# Patient Record
Sex: Male | Born: 1965 | Race: White | Hispanic: No | Marital: Single | Smoking: Never smoker
Health system: Southern US, Community
[De-identification: ages and names within clinical notes are randomized; demographics above are authoritative.]

## PROBLEM LIST (undated history)

## (undated) DIAGNOSIS — F32A Depression, unspecified: Secondary | ICD-10-CM

## (undated) DIAGNOSIS — F419 Anxiety disorder, unspecified: Secondary | ICD-10-CM

## (undated) DIAGNOSIS — F329 Major depressive disorder, single episode, unspecified: Secondary | ICD-10-CM

## (undated) DIAGNOSIS — E119 Type 2 diabetes mellitus without complications: Secondary | ICD-10-CM

## (undated) DIAGNOSIS — I10 Essential (primary) hypertension: Secondary | ICD-10-CM

## (undated) HISTORY — DX: Major depressive disorder, single episode, unspecified: F32.9

## (undated) HISTORY — DX: Type 2 diabetes mellitus without complications: E11.9

## (undated) HISTORY — DX: Depression, unspecified: F32.A

## (undated) HISTORY — DX: Anxiety disorder, unspecified: F41.9

## (undated) HISTORY — DX: Essential (primary) hypertension: I10

---

## 2005-05-21 ENCOUNTER — Emergency Department: Payer: Self-pay | Admitting: Emergency Medicine

## 2006-03-13 ENCOUNTER — Emergency Department: Payer: Self-pay | Admitting: Emergency Medicine

## 2007-07-05 ENCOUNTER — Emergency Department: Payer: Self-pay | Admitting: Emergency Medicine

## 2008-03-29 ENCOUNTER — Emergency Department: Payer: Self-pay | Admitting: Emergency Medicine

## 2010-07-20 ENCOUNTER — Ambulatory Visit: Payer: Self-pay | Admitting: Internal Medicine

## 2015-07-02 ENCOUNTER — Encounter: Payer: Self-pay | Admitting: Family Medicine

## 2015-07-02 ENCOUNTER — Ambulatory Visit (INDEPENDENT_AMBULATORY_CARE_PROVIDER_SITE_OTHER): Payer: Self-pay | Admitting: Family Medicine

## 2015-07-02 ENCOUNTER — Ambulatory Visit: Payer: Self-pay | Admitting: Family Medicine

## 2015-07-02 VITALS — BP 121/77 | HR 58 | Resp 14 | Ht <= 58 in | Wt 265.7 lb

## 2015-07-02 DIAGNOSIS — E119 Type 2 diabetes mellitus without complications: Secondary | ICD-10-CM | POA: Insufficient documentation

## 2015-07-02 DIAGNOSIS — E1140 Type 2 diabetes mellitus with diabetic neuropathy, unspecified: Secondary | ICD-10-CM | POA: Insufficient documentation

## 2015-07-02 DIAGNOSIS — R0789 Other chest pain: Secondary | ICD-10-CM | POA: Insufficient documentation

## 2015-07-02 DIAGNOSIS — E0800 Diabetes mellitus due to underlying condition with hyperosmolarity without nonketotic hyperglycemic-hyperosmolar coma (NKHHC): Secondary | ICD-10-CM

## 2015-07-02 DIAGNOSIS — I10 Essential (primary) hypertension: Secondary | ICD-10-CM | POA: Insufficient documentation

## 2015-07-02 DIAGNOSIS — IMO0002 Reserved for concepts with insufficient information to code with codable children: Secondary | ICD-10-CM | POA: Insufficient documentation

## 2015-07-02 DIAGNOSIS — E1165 Type 2 diabetes mellitus with hyperglycemia: Secondary | ICD-10-CM

## 2015-07-02 DIAGNOSIS — I4891 Unspecified atrial fibrillation: Secondary | ICD-10-CM | POA: Insufficient documentation

## 2015-07-02 NOTE — Progress Notes (Signed)
Name: George Colon   MRN: [id number]    DOB: September 13, 1966   Date:07/02/2015       Progress Note  Subjective  Chief Complaint  Chief Complaint  Patient presents with  . Hypertension    walk in- states 2-3 weeks bp has been 160-165/85-95  . Chest Pain    stopped amlodipine months ago- not taking metformin any longer due to cost.     HPI  Patient walked in to office c/o feeling funn y in his chest.  Says BP has been elevated at home.  Poor social situation.  Has stopped all but his meds except for 2 BP meds. No problem-specific assessment & plan notes found for this encounter.   Past Medical History  Diagnosis Date  . Depression   . Anxiety   . Hypertension   . Diabetes mellitus without complication [facility])     Social History  Substance Use Topics  . Smoking status: Never Smoker   . Smokeless tobacco: Never Used  . Alcohol Use: No     Current outpatient prescriptions:  .  atenolol (TENORMIN) 25 MG tablet, Take 25 mg by mouth daily., Disp: , Rfl:  .  lisinopril (PRINIVIL,ZESTRIL) 20 MG tablet, Take 20 mg by mouth daily. Taking 2 pills daily to , Disp: , Rfl:   No Known Allergies  Review of Systems  Constitutional: Negative for fever and chills.  HENT: Negative for hearing loss.   Eyes: Negative for blurred vision and double vision.  Respiratory: Negative for cough, shortness of breath and wheezing.   Cardiovascular: Positive for chest pain (feelinf funny in chest) and palpitations. Negative for leg swelling.  Gastrointestinal: Negative for heartburn, abdominal pain and blood in stool.  Genitourinary: Negative for dysuria, urgency and frequency.  Neurological: Negative for headaches.      Objective  Filed Vitals:   07/02/15 1124  BP: 121/77  Pulse: 58  Resp: 14  Height:  (1.27 m)  Weight: 265 lb 11.2 oz (120.521 kg)  SpO2: 97%     Physical Exam  Constitutional: He is oriented to person, place, and time and well-developed, well-nourished, and in  no distress. No distress.  HENT:  Head: Normocephalic and atraumatic.  Neck: Carotid bruit is not present.  Cardiovascular: Normal rate and normal heart sounds.  An irregularly irregular rhythm present. Exam reveals no gallop and no friction rub.   No murmur heard. Pulmonary/Chest: Effort normal and breath sounds normal. No respiratory distress. He has no wheezes. He has no rales.  Musculoskeletal: He exhibits no edema.  Neurological: He is alert and oriented to person, place, and time.  Vitals reviewed.     No results found for this or any previous visit (from the past 2160 hour(s)).   Assessment & Plan  1. Other chest pain  - EKG 12-Lead- atr. Fib with slow vent. Response and PACs  2. Diabetes mellitus due to underlying condition with hyperosmolarity without coma, without long-term current use of insulin (HCC)- not taking meds   3. Essential hypertension   4. Atrial fibrillation, unspecified type (HCC)- new onset

## 2015-07-02 NOTE — Patient Instructions (Signed)
To  [facility] or [facility] ER for evaluation of new onset atr. fib

## 2015-07-03 ENCOUNTER — Other Ambulatory Visit: Payer: Self-pay | Admitting: Family Medicine

## 2015-07-03 ENCOUNTER — Telehealth: Payer: Self-pay | Admitting: Family Medicine

## 2015-07-03 ENCOUNTER — Other Ambulatory Visit: Payer: Self-pay

## 2015-07-03 DIAGNOSIS — I4891 Unspecified atrial fibrillation: Secondary | ICD-10-CM

## 2015-07-03 DIAGNOSIS — I159 Secondary hypertension, unspecified: Secondary | ICD-10-CM

## 2015-07-03 DIAGNOSIS — E0800 Diabetes mellitus due to underlying condition with hyperosmolarity without nonketotic hyperglycemic-hyperosmolar coma (NKHHC): Secondary | ICD-10-CM

## 2015-07-03 DIAGNOSIS — R0789 Other chest pain: Secondary | ICD-10-CM

## 2015-07-03 MED ORDER — METOPROLOL TARTRATE 25 MG PO TABS: 25.0000 mg | ORAL_TABLET | Freq: Two times a day (BID) | ORAL | Status: DC

## 2015-07-03 NOTE — Telephone Encounter (Signed)
We cam change his atenolol to Metoprolol tartrate, 25 mg., take 1 tab. Twice a day.  Send in #60/3 refills.  Cont. His Lisinopril 40 mg/d.  Need to recheck his BP in office in 1-2 weeks.  Did they arrange a Cardiac appt. For him there at [facility]?-jh

## 2015-07-03 NOTE — Telephone Encounter (Signed)
Advised pt as per Dr. Juanetta Gosling but he wants to stay with Atenolol and lisinopril will follow up in 1-2 week and [facility] didn't set him up with social service or cardio so we will set him up with those appointment. George Colon

## 2015-07-03 NOTE — Telephone Encounter (Signed)
Pt went to ER at [facility] yesterday and EKG and labs were normal.  His BP was up slightly.  He asked if a cheapedr BP medicine could be called in for him.  Please call [phone].

## 2015-07-04 ENCOUNTER — Other Ambulatory Visit: Payer: Self-pay

## 2015-07-04 ENCOUNTER — Telehealth: Payer: Self-pay | Admitting: Family Medicine

## 2015-07-04 DIAGNOSIS — I10 Essential (primary) hypertension: Secondary | ICD-10-CM

## 2015-07-04 MED ORDER — AMLODIPINE BESYLATE 5 MG PO TABS: 5.0000 mg | ORAL_TABLET | Freq: Every day | ORAL | Status: DC

## 2015-07-04 NOTE — Telephone Encounter (Signed)
Message has been addressed. Asher Muir advised per Dr. Juanetta Gosling Amlodipine sent to pharmacy. Also patient given contact info for [facility] patient assistance. Patient also informed of [facility] paperwork for charity care and rx assistance. Pt advised not to check bp so often as they causes anxiety. Pt advised to report on Monday.

## 2015-07-04 NOTE — Telephone Encounter (Signed)
Pt. Called states that meds that was given to him was not working states that BP was still 170\105, he states that he called EMS last night, he did not go to the ER .Pt call back # is  [phone]

## 2015-07-17 ENCOUNTER — Ambulatory Visit: Payer: Self-pay | Admitting: Family Medicine

## 2015-07-24 ENCOUNTER — Ambulatory Visit: Payer: Self-pay | Admitting: Family Medicine

## 2015-10-17 ENCOUNTER — Ambulatory Visit (INDEPENDENT_AMBULATORY_CARE_PROVIDER_SITE_OTHER): Payer: Self-pay | Admitting: Family Medicine

## 2015-10-17 ENCOUNTER — Encounter: Payer: Self-pay | Admitting: Family Medicine

## 2015-10-17 VITALS — BP 120/85 | HR 60 | Temp 98.0°F | Resp 16 | Ht 72.0 in | Wt 265.0 lb

## 2015-10-17 DIAGNOSIS — R0789 Other chest pain: Secondary | ICD-10-CM

## 2015-10-17 DIAGNOSIS — E119 Type 2 diabetes mellitus without complications: Secondary | ICD-10-CM

## 2015-10-17 DIAGNOSIS — I10 Essential (primary) hypertension: Secondary | ICD-10-CM

## 2015-10-17 LAB — POCT GLYCOSYLATED HEMOGLOBIN (HGB A1C): Hemoglobin A1C: 6.7

## 2015-10-17 MED ORDER — LISINOPRIL 20 MG PO TABS: 20.0000 mg | ORAL_TABLET | Freq: Every day | ORAL | Status: DC

## 2015-10-17 MED ORDER — CARVEDILOL 3.125 MG PO TABS
3.1250 mg | ORAL_TABLET | Freq: Two times a day (BID) | ORAL | Status: DC
Start: 2015-10-17 — End: 2016-10-12

## 2015-10-17 MED ORDER — AMLODIPINE BESYLATE 5 MG PO TABS: 5.0000 mg | ORAL_TABLET | Freq: Every day | ORAL | Status: DC

## 2015-10-17 MED ORDER — METFORMIN HCL 500 MG PO TABS: 500.0000 mg | ORAL_TABLET | ORAL | Status: DC

## 2015-10-17 NOTE — Progress Notes (Signed)
Name: George Colon   MRN: [id number]    DOB: 08-07-1966   Date:10/17/2015       Progress Note  Subjective  Chief Complaint  Chief Complaint  Patient presents with  . Hypertension  . Diabetes    HPI Here for f/u of DM and HBP.  He takes Atenolol instead of Metoprolol.  Don't know when he got or started that.  Admits to not taking Metformin.  Rarely takes it (only about 1-2 doses a month).  He has adjusted Lisinopril to 20 mg/d.  He has reduced his caloric intake and has lost some weight from l;ast year.  No problem-specific assessment & plan notes found for this encounter.   Past Medical History  Diagnosis Date  . Depression   . Anxiety   . Hypertension   . Diabetes mellitus without complication (HCC)     History reviewed. No pertinent past surgical history.  Family History  Problem Relation Age of Onset  . Heart disease Mother     Social History   Social History  . Marital Status: Married    Spouse Name: N/A  . Number of Children: N/A  . Years of Education: N/A   Occupational History  . Not on file.   Social History Main Topics  . Smoking status: Never Smoker   . Smokeless tobacco: Never Used  . Alcohol Use: No  . Drug Use: No  . Sexual Activity: Not on file   Other Topics Concern  . Not on file   Social History Narrative     Current outpatient prescriptions:  .  amLODipine (NORVASC) 5 MG tablet, Take 1 tablet (5 mg total) by mouth daily., Disp: 30 tablet, Rfl: 6 .  lisinopril (PRINIVIL,ZESTRIL) 20 MG tablet, Take 1 tablet (20 mg total) by mouth daily. Taking 1 pill daily of , Disp: 30 tablet, Rfl: 6 .  carvedilol (COREG) 3.125 MG tablet, Take 1 tablet (3.125 mg total) by mouth 2 (two) times daily with a meal., Disp: 60 tablet, Rfl: 6 .  metFORMIN (GLUCOPHAGE) 500 MG tablet, Take 1 tablet (500 mg total) by mouth 1 day or 1 dose., Disp: 30 tablet, Rfl: 6  Not on File   Review of Systems  Constitutional: Positive for weight loss (desired).  Negative for fever, chills and malaise/fatigue.  HENT: Negative for hearing loss.   Eyes: Negative for blurred vision and double vision.  Respiratory: Negative for cough, shortness of breath and wheezing.   Cardiovascular: Negative for chest pain, palpitations and leg swelling.  Gastrointestinal: Negative for heartburn, abdominal pain, diarrhea and blood in stool.  Genitourinary: Negative for dysuria, urgency and frequency.  Skin: Negative for rash.  Neurological: Negative for dizziness, tremors, weakness and headaches.      Objective  Filed Vitals:   10/17/15 0826 10/17/15 0901  BP: 127/80 120/85  Pulse: 50 60  Temp: 98 F (36.7 C)   TempSrc: Oral   Resp: 16   Height: 6' (1.829 m)   Weight: 265 lb (120.203 kg)     Physical Exam  Constitutional: He is oriented to person, place, and time and well-developed, well-nourished, and in no distress. No distress.  HENT:  Head: Normocephalic and atraumatic.  Eyes: Conjunctivae and EOM are normal. Pupils are equal, round, and reactive to light. No scleral icterus.  Neck: Normal range of motion. Neck supple. No thyromegaly present.  Cardiovascular: Normal rate and regular rhythm.  Exam reveals no gallop and no friction rub.   No murmur heard. Pulmonary/Chest: Effort  normal and breath sounds normal. No respiratory distress. He has no wheezes. He has no rales.  Abdominal: Soft. Bowel sounds are normal. He exhibits no distension and no mass. There is no tenderness.  Musculoskeletal: He exhibits no edema.  Lymphadenopathy:    He has no cervical adenopathy.  Neurological: He is alert and oriented to person, place, and time.  Vitals reviewed.      Recent Results (from the past 2160 hour(s))  POCT HgB A1C     Status: Normal   Collection Time: 10/17/15  8:41 AM  Result Value Ref Range   Hemoglobin A1C 6.7 %      Assessment & Plan  Problem List Items Addressed This Visit      Cardiovascular and Mediastinum   Hypertension    Relevant Medications   amLODipine (NORVASC) 5 MG tablet   lisinopril (PRINIVIL,ZESTRIL) 20 MG tablet   carvedilol (COREG) 3.125 MG tablet     Endocrine   Diabetes (HCC) - Primary   Relevant Medications   metFORMIN (GLUCOPHAGE) 500 MG tablet   lisinopril (PRINIVIL,ZESTRIL) 20 MG tablet   Other Relevant Orders   POCT HgB A1C (Completed)     Other   Chest pain      Meds ordered this encounter  Medications  . DISCONTD: metFORMIN (GLUCOPHAGE) 500 MG tablet    Sig: Take 500 mg by mouth 2 (two) times daily with a meal. Reported on 10/17/2015.  Rarely takes Metformin (once every 2-4 weeks wqhen he thinks he needs it).  . DISCONTD: atenolol (TENORMIN) 25 MG tablet    Sig: Take 25 mg by mouth daily. Takes 1 daily  . amLODipine (NORVASC) 5 MG tablet    Sig: Take 1 tablet (5 mg total) by mouth daily.    Dispense:  30 tablet    Refill:  6  . metFORMIN (GLUCOPHAGE) 500 MG tablet    Sig: Take 1 tablet (500 mg total) by mouth 1 day or 1 dose.    Dispense:  30 tablet    Refill:  6  . lisinopril (PRINIVIL,ZESTRIL) 20 MG tablet    Sig: Take 1 tablet (20 mg total) by mouth daily. Taking 1 pill daily of     Dispense:  30 tablet    Refill:  6  . carvedilol (COREG) 3.125 MG tablet    Sig: Take 1 tablet (3.125 mg total) by mouth 2 (two) times daily with a meal.    Dispense:  60 tablet    Refill:  6   1. Type 2 diabetes mellitus without complication, without long-term current use of insulin (HCC)  - POCT HgB A1C-6.7 - metFORMIN (GLUCOPHAGE) 500 MG tablet; Take 1 tablet (500 mg total) by mouth 1 day or 1 dose.  Dispense: 30 tablet; Refill: 6  2. Essential hypertension  - amLODipine (NORVASC) 5 MG tablet; Take 1 tablet (5 mg total) by mouth daily.  Dispense: 30 tablet; Refill: 6 - lisinopril (PRINIVIL,ZESTRIL) 20 MG tablet; Take 1 tablet (20 mg total) by mouth daily. Taking 1 pill daily of   Dispense: 30 tablet; Refill: 6 - carvedilol (COREG) 3.125 MG tablet; Take 1 tablet (3.125 mg  total) by mouth 2 (two) times daily with a meal.  Dispense: 60 tablet; Refill: 6  3. Other chest pain

## 2015-11-01 ENCOUNTER — Other Ambulatory Visit: Payer: Self-pay | Admitting: Family Medicine

## 2016-02-09 ENCOUNTER — Emergency Department
Admission: EM | Admit: 2016-02-09 | Discharge: 2016-02-09 | Disposition: A | Discharge status: Home / Self Care | Payer: Self-pay | Attending: Emergency Medicine | Admitting: Emergency Medicine

## 2016-02-09 DIAGNOSIS — Z7984 Long term (current) use of oral hypoglycemic drugs: Secondary | ICD-10-CM | POA: Insufficient documentation

## 2016-02-09 DIAGNOSIS — F329 Major depressive disorder, single episode, unspecified: Secondary | ICD-10-CM | POA: Insufficient documentation

## 2016-02-09 DIAGNOSIS — I4891 Unspecified atrial fibrillation: Secondary | ICD-10-CM | POA: Insufficient documentation

## 2016-02-09 DIAGNOSIS — Z79899 Other long term (current) drug therapy: Secondary | ICD-10-CM | POA: Insufficient documentation

## 2016-02-09 DIAGNOSIS — I10 Essential (primary) hypertension: Secondary | ICD-10-CM | POA: Insufficient documentation

## 2016-02-09 DIAGNOSIS — E119 Type 2 diabetes mellitus without complications: Secondary | ICD-10-CM | POA: Insufficient documentation

## 2016-02-09 DIAGNOSIS — H66001 Acute suppurative otitis media without spontaneous rupture of ear drum, right ear: Secondary | ICD-10-CM | POA: Insufficient documentation

## 2016-02-09 DIAGNOSIS — H6121 Impacted cerumen, right ear: Secondary | ICD-10-CM | POA: Insufficient documentation

## 2016-02-09 MED ORDER — FLUTICASONE FUROATE 27.5 MCG/SPRAY NA SUSP: 2.0000 | Freq: Every day | NASAL | Status: DC

## 2016-02-09 MED ORDER — AMOXICILLIN 500 MG PO TABS: 500.0000 mg | ORAL_TABLET | Freq: Three times a day (TID) | ORAL | Status: DC

## 2016-02-09 MED ORDER — AMOXICILLIN 500 MG PO CAPS
500.0000 mg | ORAL_CAPSULE | Freq: Once | ORAL | Status: AC
  Administered 2016-02-09: 500 mg via ORAL
  Filled 2016-02-09: qty 1

## 2016-02-09 MED ORDER — CARBAMIDE PEROXIDE 6.5 % OT SOLN
5.0000 [drp] | Freq: Once | OTIC | Status: AC
  Administered 2016-02-09: 5 [drp] via OTIC
  Filled 2016-02-09: qty 15

## 2016-02-09 MED ORDER — TRAMADOL HCL 50 MG PO TABS: 50.0000 mg | ORAL_TABLET | Freq: Four times a day (QID) | ORAL | Status: DC | PRN

## 2016-02-09 MED ORDER — IBUPROFEN 600 MG PO TABS: 600.0000 mg | ORAL_TABLET | Freq: Once | ORAL | Status: DC

## 2016-02-09 NOTE — ED Notes (Signed)
Pt reports pain to his right ear canal for 2 to 3 days. Pt denies signs of cold or sinus infections. Pt denies fever or drainage.

## 2016-02-09 NOTE — Discharge Instructions (Signed)
Otitis Media, Adult °Otitis media is redness, soreness, and inflammation of the middle ear. Otitis media may be caused by allergies or, most commonly, by infection. Often it occurs as a complication of the common cold. °SIGNS AND SYMPTOMS °Symptoms of otitis media may include: °· Earache. °· Fever. °· Ringing in your ear. °· Headache. °· Leakage of fluid from the ear. °DIAGNOSIS °To diagnose otitis media, your health care provider will examine your ear with an otoscope. This is an instrument that allows your health care provider to see into your ear in order to examine your eardrum. Your health care provider also will ask you questions about your symptoms. °TREATMENT  °Typically, otitis media resolves on its own within 3-5 days. Your health care provider may prescribe medicine to ease your symptoms of pain. If otitis media does not resolve within 5 days or is recurrent, your health care provider may prescribe antibiotic medicines if he or she suspects that a bacterial infection is the cause. °HOME CARE INSTRUCTIONS  °· If you were prescribed an antibiotic medicine, finish it all even if you start to feel better. °· Take medicines only as directed by your health care provider. °· Keep all follow-up visits as directed by your health care provider. °SEEK MEDICAL CARE IF: °· You have otitis media only in one ear, or bleeding from your nose, or both. °· You notice a lump on your neck. °· You are not getting better in 3-5 days. °· You feel worse instead of better. °SEEK IMMEDIATE MEDICAL CARE IF:  °· You have pain that is not controlled with medicine. °· You have swelling, redness, or pain around your ear or stiffness in your neck. °· You notice that part of your face is paralyzed. °· You notice that the bone behind your ear (mastoid) is tender when you touch it. °MAKE SURE YOU:  °· Understand these instructions. °· Will watch your condition. °· Will get help right away if you are not doing well or get worse. °  °This  information is not intended to replace advice given to you by your health care provider. Make sure you discuss any questions you have with your health care provider. °  °Document Released: 06/19/2004 Document Revised: 10/05/2014 Document Reviewed: 04/11/2013 °Elsevier Interactive Patient Education ©2016 [employer] ° °Cerumen Impaction °The structures of the external ear canal secrete a waxy substance known as cerumen. Excess cerumen can build up in the ear canal, causing a condition known as cerumen impaction. Cerumen impaction can cause ear pain and disrupt the function of the ear. °The rate of cerumen production differs for each individual. In certain individuals, the configuration of the ear canal may decrease his or her ability to naturally remove cerumen. °CAUSES °Cerumen impaction is caused by excessive cerumen production or buildup. °RISK FACTORS °· Frequent use of swabs to clean ears. °· Having narrow ear canals. °· Having eczema. °· Being dehydrated. °SIGNS AND SYMPTOMS °· Diminished hearing. °· Ear drainage. °· Ear pain. °· Ear itch. °TREATMENT °Treatment may involve: °· Over-the-counter or prescription ear drops to soften the cerumen. °· Removal of cerumen by a health care provider. This may be done with: °¨ Irrigation with warm water. This is the most common method of removal. °¨ Ear curettes and other instruments. °¨ Surgery. This may be done in severe cases. °HOME CARE INSTRUCTIONS °· Take medicines only as directed by your health care provider. °· Do not insert objects into the ear with the intent of cleaning the ear. °PREVENTION °·   Do not insert objects into the ear, even with the intent of cleaning the ear. Removing cerumen as a part of normal hygiene is not necessary, and the use of swabs in the ear canal is not recommended. °· Drink enough water to keep your urine clear or pale yellow. °· Control your eczema if you have it. °SEEK MEDICAL CARE IF: °· You develop ear pain. °· You develop bleeding  from the ear. °· The cerumen does not clear after you use ear drops as directed. °  °This information is not intended to replace advice given to you by your health care provider. Make sure you discuss any questions you have with your health care provider. °  °Document Released: 10/22/2004 Document Revised: 10/05/2014 Document Reviewed: 05/01/2015 °Elsevier Interactive Patient Education ©2016 Elsevier Inc. ° °

## 2016-02-09 NOTE — ED Provider Notes (Signed)
[facility] Emergency Department Provider Note ____________________________________________  Time seen: Approximately 10:35 PM  I have reviewed the triage vital signs and the nursing notes.   HISTORY  Chief Complaint Otalgia    HPI George Colon is a 50 y.o. male who presents to the emergency department for evaluation of right ear pain. He reports pain has been present for the past 2-3 days and his hearing has decreased. He denies recent URI or fevers.   Past Medical History  Diagnosis Date  . Depression   . Anxiety   . Hypertension   . Diabetes mellitus without complication [facility])     Patient Active Problem List   Diagnosis Date Noted  . Diabetes (HCC) 07/02/2015  . Hypertension 07/02/2015  . Atrial fibrillation (HCC) 07/02/2015  . Chest pain 07/02/2015    No past surgical history on file.  Current Outpatient Rx  Name  Route  Sig  Dispense  Refill  . amLODipine (NORVASC) 5 MG tablet   Oral   Take 1 tablet (5 mg total) by mouth daily.   30 tablet   6   . amoxicillin (AMOXIL) 500 MG tablet   Oral   Take 1 tablet (500 mg total) by mouth 3 (three) times daily.   30 tablet   0   . carvedilol (COREG) 3.125 MG tablet   Oral   Take 1 tablet (3.125 mg total) by mouth 2 (two) times daily with a meal.   60 tablet   6   . fluticasone (VERAMYST) 27.5 MCG/SPRAY nasal spray   Nasal   Place 2 sprays into the nose daily.   10 g   0   . lisinopril (PRINIVIL,ZESTRIL) 20 MG tablet      TAKE TWO TABLETS BY MOUTH ONCE DAILY   60 tablet   0   . metFORMIN (GLUCOPHAGE) 500 MG tablet   Oral   Take 1 tablet (500 mg total) by mouth 1 day or 1 dose.   30 tablet   6   . traMADol (ULTRAM) 50 MG tablet   Oral   Take 1 tablet (50 mg total) by mouth every 6 (six) hours as needed.   12 tablet   0     Allergies Review of patient's allergies indicates no known allergies.  Family History  Problem Relation Age of Onset  . Heart disease Mother      Social History Social History  Substance Use Topics  . Smoking status: Never Smoker   . Smokeless tobacco: Never Used  . Alcohol Use: No    Review of Systems Constitutional: Negative for fever/chills Eyes: No visual changes. ENT: Positive for right earache. Gastrointestinal: No abdominal pain.  No nausea, no vomiting.  No diarrhea.  No constipation. Musculoskeletal: Negative for pain. Skin: Negative for rash. Neurological: Negative for headaches, focal weakness or numbness. ____________________________________________   PHYSICAL EXAM:  VITAL SIGNS: ED Triage Vitals  Enc Vitals Group     BP 02/09/16 2228 138/95 mmHg     Pulse Rate 02/09/16 2228 78     Resp 02/09/16 2228 18     Temp 02/09/16 2228 98.5 F (36.9 C)     Temp Source 02/09/16 2228 Oral     SpO2 02/09/16 2228 96 %     Weight 02/09/16 2224 270 lb (122.471 kg)     Height 02/09/16 2224 6' (1.829 m)     Head Cir --      Peak Flow --      Pain  Score 02/09/16 2224 1     Pain Loc --      Pain Edu? --      Excl. in GC? --     Constitutional: Alert and oriented. Well appearing and in no acute distress. Eyes: Conjunctivae are normal. PERRL. EOMI. Ears: No pain with movement of right auricle:; External canal clear;  Right TM occluded by cerumen; Left TM clear/normal   Head: Atraumatic. Nose: No congestion/rhinnorhea. Mouth/Throat: Mucous membranes are moist.  Oropharynx non-erythematous. Hematological/Lymphatic/Immunilogical: No cervical lymphadenopathy. Cardiovascular: Normal capillary refill. Respiratory: Normal respiratory effort.  No retractions.  Neurologic:  Normal speech and language. No gross focal neurologic deficits are appreciated. Speech is normal. No gait instability. Skin:  Skin is warm, dry and intact. No rash noted. ____________________________________________   LABS (all labs ordered are listed, but only abnormal results are displayed)  Labs Reviewed - No data to  display ____________________________________________   RADIOLOGY  Not indicated. ____________________________________________   PROCEDURES  Procedure(s) performed:   Right ear irrigated by me. Large amount of wax returned from irrigation with peroxide and warm water.   ____________________________________________   INITIAL IMPRESSION / ASSESSMENT AND PLAN / ED COURSE  Pertinent labs & imaging results that were available during my care of the patient were reviewed by me and considered in my medical decision making (see chart for details).  Prescriptions for amoxicillin, fluticasone, and tramadol will be given today.  The patient was advised to follow up with PCP for symptoms that are not improving over the next 48 hours. He was advised to return to the emergency department for symptoms that change or worsen if unable to schedule an appointment.  ____________________________________________   FINAL CLINICAL IMPRESSION(S) / ED DIAGNOSES  Final diagnoses:  Cerumen impaction, right  Acute suppurative otitis media of right ear without spontaneous rupture of tympanic membrane, recurrence not specified    Note:  This document was prepared using Dragon voice recognition software and may include unintentional dictation errors.     Chinita PesterCari B Savita Runner, FNP 02/09/16 [id number]  Sharyn CreamerMark Quale, MD 02/15/16 2115

## 2016-02-14 ENCOUNTER — Telehealth: Payer: Self-pay | Admitting: Family Medicine

## 2016-02-14 NOTE — Telephone Encounter (Signed)
Appt has been scheduled 02/20/16.[place]

## 2016-02-14 NOTE — Telephone Encounter (Signed)
Pt.have question about ear pain .  Pt call back # is  [phone]

## 2016-02-15 ENCOUNTER — Emergency Department
Admission: EM | Admit: 2016-02-15 | Discharge: 2016-02-15 | Disposition: A | Discharge status: Home / Self Care | Payer: Self-pay | Attending: Student | Admitting: Student

## 2016-02-15 ENCOUNTER — Encounter: Payer: Self-pay | Admitting: Emergency Medicine

## 2016-02-15 DIAGNOSIS — I10 Essential (primary) hypertension: Secondary | ICD-10-CM | POA: Insufficient documentation

## 2016-02-15 DIAGNOSIS — Z79899 Other long term (current) drug therapy: Secondary | ICD-10-CM | POA: Insufficient documentation

## 2016-02-15 DIAGNOSIS — Z7951 Long term (current) use of inhaled steroids: Secondary | ICD-10-CM | POA: Insufficient documentation

## 2016-02-15 DIAGNOSIS — Z7984 Long term (current) use of oral hypoglycemic drugs: Secondary | ICD-10-CM | POA: Insufficient documentation

## 2016-02-15 DIAGNOSIS — F329 Major depressive disorder, single episode, unspecified: Secondary | ICD-10-CM | POA: Insufficient documentation

## 2016-02-15 DIAGNOSIS — I4891 Unspecified atrial fibrillation: Secondary | ICD-10-CM | POA: Insufficient documentation

## 2016-02-15 DIAGNOSIS — E119 Type 2 diabetes mellitus without complications: Secondary | ICD-10-CM | POA: Insufficient documentation

## 2016-02-15 DIAGNOSIS — H6091 Unspecified otitis externa, right ear: Secondary | ICD-10-CM | POA: Insufficient documentation

## 2016-02-15 MED ORDER — PSEUDOEPHEDRINE HCL ER 120 MG PO TB12: 120.0000 mg | ORAL_TABLET | Freq: Two times a day (BID) | ORAL | Status: DC | PRN

## 2016-02-15 MED ORDER — CIPROFLOXACIN-DEXAMETHASONE 0.3-0.1 % OT SUSP: 4.0000 [drp] | Freq: Two times a day (BID) | OTIC | Status: DC

## 2016-02-15 MED ORDER — OXYCODONE-ACETAMINOPHEN 7.5-325 MG PO TABS: 1.0000 | ORAL_TABLET | ORAL | Status: DC | PRN

## 2016-02-15 NOTE — ED Provider Notes (Signed)
[facility] Emergency Department Provider Note   ____________________________________________  Time seen: Approximately 5:14 PM  I have reviewed the triage vital signs and the nursing notes.   HISTORY  Chief Complaint Otalgia    HPI George Colon is a 50 y.o. male patient complaining of right ear pain. Patient was seen 6 days ago was diagnosed with otitis media and prescribed tramadol and amoxicillin. Patient state pain is getting worse in the right ear. Patient states him try to clean the ear with Q-tips. Patient also pertinent over-the-counter antibiotic cream is placed in the ear canal. Patient denies any hearing loss or vertigo. Patient rated his pain as a 7/10. No other palliative measures for this complaint. Patient described a pain as "sharp".   Past Medical History  Diagnosis Date  . Depression   . Anxiety   . Hypertension   . Diabetes mellitus without complication [facility])     Patient Active Problem List   Diagnosis Date Noted  . Diabetes (HCC) 07/02/2015  . Hypertension 07/02/2015  . Atrial fibrillation (HCC) 07/02/2015  . Chest pain 07/02/2015    History reviewed. No pertinent past surgical history.  Current Outpatient Rx  Name  Route  Sig  Dispense  Refill  . amLODipine (NORVASC) 5 MG tablet   Oral   Take 1 tablet (5 mg total) by mouth daily.   30 tablet   6   . amoxicillin (AMOXIL) 500 MG tablet   Oral   Take 1 tablet (500 mg total) by mouth 3 (three) times daily.   30 tablet   0   . carvedilol (COREG) 3.125 MG tablet   Oral   Take 1 tablet (3.125 mg total) by mouth 2 (two) times daily with a meal.   60 tablet   6   . ciprofloxacin-dexamethasone (CIPRODEX) otic suspension   Left Ear   Place 4 drops into the left ear 2 (two) times daily.   7.5 mL   0   . fluticasone (VERAMYST) 27.5 MCG/SPRAY nasal spray   Nasal   Place 2 sprays into the nose daily.   10 g   0   . lisinopril (PRINIVIL,ZESTRIL) 20 MG tablet        TAKE TWO TABLETS BY MOUTH ONCE DAILY   60 tablet   0   . metFORMIN (GLUCOPHAGE) 500 MG tablet   Oral   Take 1 tablet (500 mg total) by mouth 1 day or 1 dose.   30 tablet   6   . oxyCODONE-acetaminophen (PERCOCET) 7.5-325 MG tablet   Oral   Take 1 tablet by mouth every 4 (four) hours as needed for severe pain.   20 tablet   0   . pseudoephedrine (SUDAFED) 120 MG 12 hr tablet   Oral   Take 1 tablet (120 mg total) by mouth 2 (two) times daily as needed for congestion.   20 tablet   2   . traMADol (ULTRAM) 50 MG tablet   Oral   Take 1 tablet (50 mg total) by mouth every 6 (six) hours as needed.   12 tablet   0     Allergies Review of patient's allergies indicates no known allergies.  Family History  Problem Relation Age of Onset  . Heart disease Mother     Social History Social History  Substance Use Topics  . Smoking status: Never Smoker   . Smokeless tobacco: Never Used  . Alcohol Use: No    Review of Systems Constitutional:  No fever/chills Eyes: No visual changes. ENT: No sore throat. Cardiovascular: Denies chest pain. Respiratory: Denies shortness of breath. Gastrointestinal: No abdominal pain.  No nausea, no vomiting.  No diarrhea.  No constipation. Genitourinary: Negative for dysuria. Musculoskeletal: Negative for back pain. Skin: Negative for rash. Neurological: Negative for headaches, focal weakness or numbness. Psychiatric:Anxiety and depression. Endocrine:Diabetes and hypertension. ____________________________________________   PHYSICAL EXAM:  VITAL SIGNS: ED Triage Vitals  Enc Vitals Group     BP 02/15/16 1523 150/98 mmHg     Pulse Rate 02/15/16 1523 72     Resp 02/15/16 1523 18     Temp 02/15/16 1523 98.1 F (36.7 C)     Temp Source 02/15/16 1523 Oral     SpO2 02/15/16 1523 97 %     Weight 02/15/16 1523 270 lb (122.471 kg)     Height 02/15/16 1523 6' (1.829 m)     Head Cir --      Peak Flow --      Pain Score 02/15/16 1523 7      Pain Loc --      Pain Edu? --      Excl. in GC? --     Constitutional: Alert and oriented. Well appearing and in no acute distress. Eyes: Conjunctivae are normal. PERRL. EOMI. Head: Atraumatic. Nose: No congestion/rhinnorhea. EARS: Right ear canal is edematous with mild edema. TM is edematous but nonerythematous. Mouth/Throat: Mucous membranes are moist.  Oropharynx non-erythematous. Neck: No stridor.  No cervical spine tenderness to palpation. Cardiovascular: Normal rate, regular rhythm. Grossly normal heart sounds.  Good peripheral circulation. Respiratory: Normal respiratory effort.  No retractions. Lungs CTAB. Gastrointestinal: Soft and nontender. No distention. No abdominal bruits. No CVA tenderness. Musculoskeletal: No lower extremity tenderness nor edema.  No joint effusions. Neurologic:  Normal speech and language. No gross focal neurologic deficits are appreciated. No gait instability. Skin:  Skin is warm, dry and intact. No rash noted. Psychiatric: Mood and affect are normal. Speech and behavior are normal.  ____________________________________________   LABS (all labs ordered are listed, but only abnormal results are displayed)  Labs Reviewed - No data to display ____________________________________________  EKG   ____________________________________________  RADIOLOGY   ____________________________________________   PROCEDURES  Procedure(s) performed: None  Critical Care performed: No  ____________________________________________   INITIAL IMPRESSION / ASSESSMENT AND PLAN / ED COURSE  Pertinent labs & imaging results that were available during my care of the patient were reviewed by me and considered in my medical decision making (see chart for details).  Resolving otitis media with right otitis external.Continue taking antibiotics as directed. Patient given a prescription for Ciprodex and Percocet. Patient advised to follow-up with the ENT clinic  if there is no improvement in 2 days. ____________________________________________   FINAL CLINICAL IMPRESSION(S) / ED DIAGNOSES  Final diagnoses:  External otitis of right ear      NEW MEDICATIONS STARTED DURING THIS VISIT:  New Prescriptions   CIPROFLOXACIN-DEXAMETHASONE (CIPRODEX) OTIC SUSPENSION    Place 4 drops into the left ear 2 (two) times daily.   OXYCODONE-ACETAMINOPHEN (PERCOCET) 7.5-325 MG TABLET    Take 1 tablet by mouth every 4 (four) hours as needed for severe pain.   PSEUDOEPHEDRINE (SUDAFED) 120 MG 12 HR TABLET    Take 1 tablet (120 mg total) by mouth 2 (two) times daily as needed for congestion.     Note:  This document was prepared using Dragon voice recognition software and may include unintentional dictation errors.    Arther Abbottonald K  Katrinka Blazing, PA-C 02/15/16 1733  Gayla Doss, MD 02/16/16 [phone]

## 2016-02-15 NOTE — Discharge Instructions (Signed)
Otitis Externa °Otitis externa is a germ infection in the outer ear. The outer ear is the area from the eardrum to the outside of the ear. Otitis externa is sometimes called "swimmer's ear." °HOME CARE °· Put drops in the ear as told by your doctor. °· Only take medicine as told by your doctor. °· If you have diabetes, your doctor may give you more directions. Follow your doctor's directions. °· Keep all doctor visits as told. °To avoid another infection: °· Keep your ear dry. Use the corner of a towel to dry your ear after swimming or bathing. °· Avoid scratching or putting things inside your ear. °· Avoid swimming in lakes, dirty water, or pools that use a chemical called chlorine poorly. °· You may use ear drops after swimming. Combine equal amounts of white vinegar and alcohol in a bottle. Put 3 or 4 drops in each ear. °GET HELP IF:  °· You have a fever. °· Your ear is still red, puffy (swollen), or painful after 3 days. °· You still have yellowish-white fluid (pus) coming from the ear after 3 days. °· Your redness, puffiness, or pain gets worse. °· You have a really bad headache. °· You have redness, puffiness, pain, or tenderness behind your ear. °MAKE SURE YOU:  °· Understand these instructions. °· Will watch your condition. °· Will get help right away if you are not doing well or get worse. °  °This information is not intended to replace advice given to you by your health care provider. Make sure you discuss any questions you have with your health care provider. °  °Document Released: 03/02/2008 Document Revised: 10/05/2014 Document Reviewed: 10/01/2011 °Elsevier Interactive Patient Education ©2016 [employer] ° °Ear Drops, Adult °You need to put eardrops in your ear. °HOME CARE  °· Put drops in your affected ear as told. °· After putting in the drops, lie down with the ear you put the drops in facing up. Stay this way for 10 minutes. Use the ear drops as long as your doctor tells you. °· Before you get  up, put a cotton ball gently in your ear. Do not push it far in your ear. °· Do not wash out your ears unless your doctor says it is okay. °· Finish all medicines as told by your doctor. You may be told to keep using the eardrops even if you start to feel better. °· See your doctor as told for follow-up visits. °GET HELP IF: °· You have pain that gets worse. °· Any unusual fluid (drainage) is coming from your ear (especially if the fluid stinks). °· You have trouble hearing. °· You get really dizzy as if the room is spinning and feel sick to your stomach (vertigo). °· The outside of your ear becomes red or puffy or both. This may be a sign of an allergic reaction. °MAKE SURE YOU:  °· Understand these instructions. °· Will watch your condition. °· Will get help right away if you are not doing well or get worse. °  °This information is not intended to replace advice given to you by your health care provider. Make sure you discuss any questions you have with your health care provider. °  °Document Released: 03/04/2010 Document Revised: 10/05/2014 Document Reviewed: 04/11/2013 °Elsevier Interactive Patient Education ©2016 [employer] ° °

## 2016-02-15 NOTE — ED Notes (Signed)
Right ear pain, was seen here for the same on Monday, has been taking antibiotics, feels pain is way worse.

## 2016-02-15 NOTE — ED Notes (Addendum)
Patient continues to c/o ear pain on the right side. Taking Amoxicillin as prescribed, but is getting worse.  Patient c/o pain in the side of his head.

## 2016-02-18 ENCOUNTER — Ambulatory Visit (INDEPENDENT_AMBULATORY_CARE_PROVIDER_SITE_OTHER): Payer: Self-pay | Admitting: Family Medicine

## 2016-02-18 ENCOUNTER — Encounter: Payer: Self-pay | Admitting: Family Medicine

## 2016-02-18 VITALS — BP 117/85 | HR 65 | Temp 98.6°F | Resp 16 | Ht 72.0 in | Wt 276.0 lb

## 2016-02-18 DIAGNOSIS — H6090 Unspecified otitis externa, unspecified ear: Secondary | ICD-10-CM | POA: Insufficient documentation

## 2016-02-18 DIAGNOSIS — H6091 Unspecified otitis externa, right ear: Secondary | ICD-10-CM

## 2016-02-18 NOTE — Progress Notes (Signed)
Name: George Colon   MRN: [id number]    DOB: [birth year]   Date:02/18/2016       Progress Note  Subjective  Chief Complaint  Chief Complaint  Patient presents with  . Ear Pain    Right side onset 10 days saturday was worst pt has ear drops helps some    HPI Here c/o R ear pain for about 10 - 14 days.  Started as decreased hearing and some pain.  @ trips to ER .  Treated with Amoxil then later with Cortipsorin otic drops (3 days ago.  Some improvement in pain since starting drops.  No fever.  Pain to move ear.  No problem-specific assessment & plan notes found for this encounter.   Past Medical History  Diagnosis Date  . Depression   . Anxiety   . Hypertension   . Diabetes mellitus without complication [facility])     Social History  Substance Use Topics  . Smoking status: Never Smoker   . Smokeless tobacco: Never Used  . Alcohol Use: No     Current outpatient prescriptions:  .  amLODipine (NORVASC) 5 MG tablet, Take 1 tablet (5 mg total) by mouth daily., Disp: 30 tablet, Rfl: 6 .  amoxicillin (AMOXIL) 500 MG tablet, Take 1 tablet (500 mg total) by mouth 3 (three) times daily., Disp: 30 tablet, Rfl: 0 .  carvedilol (COREG) 3.125 MG tablet, Take 1 tablet (3.125 mg total) by mouth 2 (two) times daily with a meal., Disp: 60 tablet, Rfl: 6 .  ciprofloxacin-dexamethasone (CIPRODEX) otic suspension, Place 4 drops into the left ear 2 (two) times daily., Disp: 7.5 mL, Rfl: 0 .  fluticasone (VERAMYST) 27.5 MCG/SPRAY nasal spray, Place 2 sprays into the nose daily., Disp: 10 g, Rfl: 0 .  lisinopril (PRINIVIL,ZESTRIL) 20 MG tablet, TAKE TWO TABLETS BY MOUTH ONCE DAILY, Disp: 60 tablet, Rfl: 0 .  metFORMIN (GLUCOPHAGE) 500 MG tablet, Take 1 tablet (500 mg total) by mouth 1 day or 1 dose., Disp: 30 tablet, Rfl: 6 .  oxyCODONE-acetaminophen (PERCOCET) 7.5-325 MG tablet, Take 1 tablet by mouth every 4 (four) hours as needed for severe pain., Disp: 20 tablet, Rfl: 0 .  pseudoephedrine (SUDAFED)  120 MG 12 hr tablet, Take 1 tablet (120 mg total) by mouth 2 (two) times daily as needed for congestion., Disp: 20 tablet, Rfl: 2 .  traMADol (ULTRAM) 50 MG tablet, Take 1 tablet (50 mg total) by mouth every 6 (six) hours as needed., Disp: 12 tablet, Rfl: 0  Not on File  Review of Systems  Constitutional: Negative for fever, chills, weight loss and malaise/fatigue.  HENT: Positive for ear pain, hearing loss and tinnitus. Negative for congestion, ear discharge and sore throat.   Eyes: Negative for blurred vision and double vision.  Respiratory: Negative for cough, shortness of breath and wheezing.   Cardiovascular: Negative for chest pain, palpitations and leg swelling.  Gastrointestinal: Negative for heartburn, abdominal pain and blood in stool.  Genitourinary: Negative for dysuria, urgency and frequency.  Skin: Negative for rash.  Neurological: Negative for weakness and headaches.      Objective  Filed Vitals:   02/18/16 0759  BP: 117/85  Pulse: 65  Temp: 98.6 F (37 C)  TempSrc: Oral  Resp: 16  Height: 6' (1.829 m)  Weight: 276 lb (125.193 kg)     Physical Exam  Constitutional: He is well-developed, well-nourished, and in no distress. No distress.  HENT:  Head: Normocephalic and atraumatic.  Right Ear: Tympanic membrane  normal. There is tenderness. Decreased hearing is noted.  Left Ear: External ear normal.  Ears:  Nose: Nose normal.  Mouth/Throat: Oropharynx is clear and moist.  Neck: Normal range of motion. Neck supple. No thyromegaly present.  Cardiovascular: Normal rate, regular rhythm and normal heart sounds.   Pulmonary/Chest: Effort normal and breath sounds normal. No respiratory distress. He has no wheezes. He has no rales.  Lymphadenopathy:    He has no cervical adenopathy.       Right cervical: No superficial cervical, no deep cervical and no posterior cervical adenopathy present.      Left cervical: No superficial cervical, no deep cervical and no  posterior cervical adenopathy present.  Vitals reviewed.     No results found for this or any previous visit (from the past 2160 hour(s)).   Assessment & Plan  1. Otitis externa, right Finish Amoxil and cont Cortisporin Otic drops 4 times a day. See Dr. Willeen CassBennett if not resolving

## 2016-04-03 ENCOUNTER — Emergency Department
Admission: EM | Admit: 2016-04-03 | Discharge: 2016-04-03 | Disposition: A | Discharge status: Home / Self Care | Payer: Self-pay | Attending: Student | Admitting: Student

## 2016-04-03 ENCOUNTER — Encounter: Payer: Self-pay | Admitting: Emergency Medicine

## 2016-04-03 DIAGNOSIS — F329 Major depressive disorder, single episode, unspecified: Secondary | ICD-10-CM | POA: Insufficient documentation

## 2016-04-03 DIAGNOSIS — Z7984 Long term (current) use of oral hypoglycemic drugs: Secondary | ICD-10-CM | POA: Insufficient documentation

## 2016-04-03 DIAGNOSIS — Z7951 Long term (current) use of inhaled steroids: Secondary | ICD-10-CM | POA: Insufficient documentation

## 2016-04-03 DIAGNOSIS — I4891 Unspecified atrial fibrillation: Secondary | ICD-10-CM | POA: Insufficient documentation

## 2016-04-03 DIAGNOSIS — I10 Essential (primary) hypertension: Secondary | ICD-10-CM | POA: Insufficient documentation

## 2016-04-03 DIAGNOSIS — K047 Periapical abscess without sinus: Secondary | ICD-10-CM

## 2016-04-03 DIAGNOSIS — K029 Dental caries, unspecified: Secondary | ICD-10-CM | POA: Insufficient documentation

## 2016-04-03 DIAGNOSIS — Z79899 Other long term (current) drug therapy: Secondary | ICD-10-CM | POA: Insufficient documentation

## 2016-04-03 DIAGNOSIS — E119 Type 2 diabetes mellitus without complications: Secondary | ICD-10-CM | POA: Insufficient documentation

## 2016-04-03 MED ORDER — OXYCODONE-ACETAMINOPHEN 5-325 MG PO TABS: 1.0000 | ORAL_TABLET | ORAL | Status: DC | PRN

## 2016-04-03 MED ORDER — LIDOCAINE VISCOUS 2 % MT SOLN: 20.0000 mL | OROMUCOSAL | Status: DC | PRN

## 2016-04-03 MED ORDER — AMOXICILLIN 500 MG PO TABS: 500.0000 mg | ORAL_TABLET | Freq: Two times a day (BID) | ORAL | Status: DC

## 2016-04-03 NOTE — ED Provider Notes (Signed)
[facility] Emergency Department Provider Note  ____________________________________________  Time seen: Approximately 3:44 PM  I have reviewed the triage vital signs and the nursing notes.   HISTORY  Chief Complaint Dental Pain    HPI George Colon is a 50 y.o. male who presents for evaluation of continued dental pain for dental caries right lower aspect of his jaw. States pain is 10 out of 10 and does not have the money to see a dentist at this time. Admits to being homeless at different times.   Past Medical History  Diagnosis Date  . Depression   . Anxiety   . Hypertension   . Diabetes mellitus without complication [facility](HCC)     Patient Active Problem List   Diagnosis Date Noted  . Otitis externa 02/18/2016  . Diabetes (HCC) 07/02/2015  . Hypertension 07/02/2015  . Atrial fibrillation (HCC) 07/02/2015  . Chest pain 07/02/2015    History reviewed. No pertinent past surgical history.  Current Outpatient Rx  Name  Route  Sig  Dispense  Refill  . amLODipine (NORVASC) 5 MG tablet   Oral   Take 1 tablet (5 mg total) by mouth daily.   30 tablet   6   . amoxicillin (AMOXIL) 500 MG tablet   Oral   Take 1 tablet (500 mg total) by mouth 2 (two) times daily.   20 tablet   0   . carvedilol (COREG) 3.125 MG tablet   Oral   Take 1 tablet (3.125 mg total) by mouth 2 (two) times daily with a meal.   60 tablet   6   . fluticasone (VERAMYST) 27.5 MCG/SPRAY nasal spray   Nasal   Place 2 sprays into the nose daily.   10 g   0   . lidocaine (XYLOCAINE) 2 % solution   Mouth/Throat   Use as directed 20 mLs in the mouth or throat as needed for mouth pain.   100 mL   0   . lisinopril (PRINIVIL,ZESTRIL) 20 MG tablet      TAKE TWO TABLETS BY MOUTH ONCE DAILY   60 tablet   0   . metFORMIN (GLUCOPHAGE) 500 MG tablet   Oral   Take 1 tablet (500 mg total) by mouth 1 day or 1 dose.   30 tablet   6   . oxyCODONE-acetaminophen (ROXICET) 5-325  MG tablet   Oral   Take 1-2 tablets by mouth every 4 (four) hours as needed for severe pain.   15 tablet   0     Allergies Review of patient's allergies indicates no known allergies.  Family History  Problem Relation Age of Onset  . Heart disease Mother     Social History Social History  Substance Use Topics  . Smoking status: Never Smoker   . Smokeless tobacco: Never Used  . Alcohol Use: No    Review of Systems Constitutional: No fever/chills ENT: Positive toothache with dental caries. Cardiovascular: Denies chest pain. Respiratory: Denies shortness of breath. Musculoskeletal: Negative for back pain. Skin: Negative for rash. Neurological: Negative for headaches, focal weakness or numbness.  10-point ROS otherwise negative.  ____________________________________________   PHYSICAL EXAM:  VITAL SIGNS: ED Triage Vitals  Enc Vitals Group     BP 04/03/16 1457 153/94 mmHg     Pulse Rate 04/03/16 1457 67     Resp 04/03/16 1457 20     Temp 04/03/16 1457 97.6 F (36.4 C)     Temp Source 04/03/16 1457 Oral  SpO2 04/03/16 1457 97 %     Weight 04/03/16 1457 270 lb (122.471 kg)     Height 04/03/16 1457 6' (1.829 m)     Head Cir --      Peak Flow --      Pain Score 04/03/16 1457 5     Pain Loc --      Pain Edu? --      Excl. in GC? --     Constitutional: Alert and oriented. Well appearing and in no acute distress. Mouth/Throat: Mucous membranes are moist.  Oropharynx non-erythematous.Positive dental caries. Neck: No stridor. Supple, full range of motion nontender.  Musculoskeletal: No lower extremity tenderness nor edema.  No joint effusions. Neurologic:  Normal speech and language. No gross focal neurologic deficits are appreciated. No gait instability. Skin:  Skin is warm, dry and intact. No rash noted. Psychiatric: Mood and affect are normal. Speech and behavior are normal.  ____________________________________________   LABS (all labs ordered are  listed, but only abnormal results are displayed)  Labs Reviewed - No data to display ____________________________________________  EKG   ____________________________________________  RADIOLOGY   ____________________________________________   PROCEDURES  Procedure(s) performed: None  Critical Care performed: No  ____________________________________________   INITIAL IMPRESSION / ASSESSMENT AND PLAN / ED COURSE  Pertinent labs & imaging results that were available during my care of the patient were reviewed by me and considered in my medical decision making (see chart for details).  Acute dental caries with toothache. Rx given for amoxicillin 500 mg 3 times a day, Motrin 800 mg 3 times a day as well as I can 5/325. Patient follow-up PCP or return to ER with worsening symptomology. ____________________________________________   FINAL CLINICAL IMPRESSION(S) / ED DIAGNOSES  Final diagnoses:  None     This chart was dictated using voice recognition software/Dragon. Despite best efforts to proofread, errors can occur which can change the meaning. Any change was purely unintentional.   Evangeline Dakinharles M Parv Manthey, PA-C 04/03/16 1553  Gayla DossEryka A Gayle, MD 04/04/16 [phone]

## 2016-04-03 NOTE — Discharge Instructions (Signed)
OPTIONS FOR DENTAL FOLLOW UP CARE ° °[place] Department of Health and Human Services - Local Safety Net Dental Clinics °http://www.ncdhhs.gov/dph/oralhealth/services/safetynetclinics.htm °  °[facility] (336-562-3123) ° °[facility] [place] (919-933-9087) ° °[facility] ([phone] ext [phone]) ° °[facility] ([phone]) ° °[facility] ([phone]) °This clinic caters to the indigent population and is on a lottery system. °Location: °[facility], [address], [place] °Clinic Hours: °Wednesdays from 6pm - 9pm, patients seen by a lottery system. °For dates, call or go to [email or web address].[email or web address]/[facility]ac/patients/Dental-[facility] °Services: °Cleanings, fillings and simple extractions. °Payment Options: °DENTAL WORK IS FREE OF CHARGE. Bring proof of income or support. °Best way to get seen: °Arrive at 5:15 pm - this is a lottery, NOT first come/first serve, so arriving earlier will not increase your chances of being seen. °  °  °[facility] °[phone] °Select option 1 for emergencies °  °Location: °[facility], [address], [place] °Clinic Hours: °No walk-ins accepted - call the day before to schedule an appointment. °Check in times are 9:30 am and 1:30 pm. °Services: °Simple extractions, temporary fillings, pulpectomy/pulp debridement, uncomplicated abscess drainage. °Payment Options: °PAYMENT IS DUE AT THE TIME OF SERVICE.  Fee is usually $100-200, additional surgical procedures (e.g. abscess drainage) may be extra. °Cash, checks, Visa/MasterCard accepted.  Can file Medicaid if patient is covered for dental - patient should call case worker to check. °No discount for [facility] patients. °Best way to get seen: °MUST call the day before and get onto the schedule. Can usually be seen the next 1-2 days. No walk-ins accepted. °  °  °[facility] °[phone] °   °Location: °[facility], [address], [place] °Clinic Hours: °M, W, Th, F 8am or 1:30pm, Tues 9a or 1:30 - first come/first served. °Services: °Simple extractions, temporary fillings, uncomplicated abscess drainage.  You do not need to be an [place] resident. °Payment Options: °PAYMENT IS DUE AT THE TIME OF SERVICE. °Dental insurance, otherwise sliding scale - bring proof of income or support. °Depending on income and treatment needed, cost is usually $50-200. °Best way to get seen: °Arrive early as it is first come/first served. °  °  °[facility] °[phone] °  °Location: °[address] °Clinic Hours: °Mon-Thu 8a-5p °Services: °Most basic dental services including extractions and fillings. °Payment Options: °PAYMENT IS DUE AT THE TIME OF SERVICE. °Sliding scale, up to 50% off - bring proof if income or support. °Medicaid with dental option accepted. °Best way to get seen: °Call to schedule an appointment, can usually be seen within 2 weeks OR they will try to see walk-ins - show up at 8a or 2p (you may have to wait). °  °  °[facility] °919-245-2435 °[place] RESIDENTS ONLY °  °Location: °[facility], [address], [place], [place] [address] °Clinic Hours: By appointment only. °Monday - Thursday 8am-5pm, Friday 8am-12pm °Services: Cleanings, fillings, extractions. °Payment Options: °PAYMENT IS DUE AT THE TIME OF SERVICE. °Cash, Visa or MasterCard. Sliding scale - $30 minimum per service. °Best way to get seen: °Come in to office, complete packet and make an appointment - need proof of income °or support monies for each household member and proof of [place] residence. °Usually takes about a month to get in. °  °  °[facility] °[phone] °  °Location: °[address]   [place] °Clinic Hours: Walk-in Urgent Care Dental Services are offered Monday-Friday  mornings only. °The numbers of emergencies accepted daily is limited to the number of °providers available. °Maximum 15 - Mondays, Wednesdays & Thursdays °Maximum 10 - Tuesdays & Fridays °Services: °You do not need to be a [place] resident to be seen for a dental emergency. °Emergencies are defined as pain, swelling, abnormal bleeding, or dental trauma. Walkins will receive x-rays if needed. °NOTE: Dental cleaning is not an emergency. °Payment Options: °PAYMENT IS DUE AT THE TIME OF SERVICE. °Minimum co-pay is $40.00 for uninsured patients. °Minimum co-pay is $3.00 for Medicaid with dental coverage. °Dental Insurance is accepted and must be presented at time of visit. °Medicare does not cover dental. °Forms of payment: Cash, credit card, checks. °Best way to get seen: °If not previously registered with the clinic, walk-in dental registration begins at 7:15 am and is on a first come/first serve basis. °If previously registered with the clinic, call to make an appointment. °  °  °[facility] °919-776-4359 °[place] RESIDENTS ONLY °  °Location: °[address], [place], [place] °Clinic Hours: °Mon-Thu 10a-2p °Services: Extractions only! °Payment Options: °FREE (donations accepted) - bring proof of income or support °Best way to get seen: °Call and schedule an appointment OR come at 8am on the 1st Monday of every month (except for holidays) when it is first come/first served. °  °  °[facility] °[phone] °  °Location: °[address], [place] °Clinic Hours: °Friday mornings °Services, Payment Options, Best way to get seen: °Call for info °

## 2016-04-03 NOTE — ED Notes (Signed)
Patient to ER for c/o pain to right lower dentition.

## 2016-04-07 ENCOUNTER — Ambulatory Visit: Payer: Self-pay | Admitting: Family Medicine

## 2016-05-07 ENCOUNTER — Telehealth: Payer: Self-pay | Admitting: Family Medicine

## 2016-05-07 MED ORDER — HYDROCHLOROTHIAZIDE 25 MG PO TABS: 25.0000 mg | ORAL_TABLET | Freq: Every day | ORAL | 12 refills | Status: DC

## 2016-05-07 NOTE — Telephone Encounter (Signed)
He could take HCTZ 25 mg/d.  It is cheaper, but it may make his sugar a little worse.  Send in #30 / 12 refills.  He will need a BMP in about a month to check lytes.-jh

## 2016-05-07 NOTE — Telephone Encounter (Signed)
Medication sent and patient aware.[place]

## 2016-05-07 NOTE — Telephone Encounter (Signed)
Pt. Called states he could not afford the $17.00 medication  Amlodipine  Wanted to  Know if you would call something in  Cheaper   [facility] in [place]   Pt  Call back  # is [phone]

## 2016-05-13 ENCOUNTER — Encounter: Payer: Self-pay | Admitting: Emergency Medicine

## 2016-05-13 ENCOUNTER — Emergency Department
Admission: EM | Admit: 2016-05-13 | Discharge: 2016-05-13 | Disposition: A | Discharge status: Home / Self Care | Payer: Self-pay | Attending: Emergency Medicine | Admitting: Emergency Medicine

## 2016-05-13 DIAGNOSIS — Z7951 Long term (current) use of inhaled steroids: Secondary | ICD-10-CM | POA: Insufficient documentation

## 2016-05-13 DIAGNOSIS — K047 Periapical abscess without sinus: Secondary | ICD-10-CM

## 2016-05-13 DIAGNOSIS — K0889 Other specified disorders of teeth and supporting structures: Secondary | ICD-10-CM

## 2016-05-13 DIAGNOSIS — I10 Essential (primary) hypertension: Secondary | ICD-10-CM | POA: Insufficient documentation

## 2016-05-13 DIAGNOSIS — Z7984 Long term (current) use of oral hypoglycemic drugs: Secondary | ICD-10-CM | POA: Insufficient documentation

## 2016-05-13 DIAGNOSIS — K029 Dental caries, unspecified: Secondary | ICD-10-CM | POA: Insufficient documentation

## 2016-05-13 DIAGNOSIS — Z79899 Other long term (current) drug therapy: Secondary | ICD-10-CM | POA: Insufficient documentation

## 2016-05-13 DIAGNOSIS — E119 Type 2 diabetes mellitus without complications: Secondary | ICD-10-CM | POA: Insufficient documentation

## 2016-05-13 MED ORDER — AMOXICILLIN 500 MG PO TABS: 500.0000 mg | ORAL_TABLET | Freq: Three times a day (TID) | ORAL | 0 refills | Status: DC

## 2016-05-13 MED ORDER — CLINDAMYCIN HCL 300 MG PO CAPS: 300.0000 mg | ORAL_CAPSULE | Freq: Three times a day (TID) | ORAL | 0 refills | Status: DC

## 2016-05-13 MED ORDER — IBUPROFEN 800 MG PO TABS: 800.0000 mg | ORAL_TABLET | Freq: Three times a day (TID) | ORAL | 0 refills | Status: DC | PRN

## 2016-05-13 NOTE — ED Triage Notes (Signed)
Pt ambulatory to flex in NAD, reports broken teeth on lower front, causing pain. Pt reports ongoing dental problems but has not been able to see a dentist.

## 2016-05-13 NOTE — ED Provider Notes (Signed)
[facility] Emergency Department Provider Note  ____________________________________________  Time seen: Approximately 7:57 PM  I have reviewed the triage vital signs and the nursing notes.   HISTORY  Chief Complaint Dental Pain    HPI George Colon is a 50 y.o. male, NAD, presents to the emergency department for evaluation of dental pain. Patient endorses chronic dental issues including cavities and tooth loss. Patient states that the remaining teeth are broken and chipped all the way to the gum line. Patient has been to a few dental clinics and has been told that it will cost a few thousand dollars to fix his problems as he will need to be seen by an oral surgeon. Patient states that he was given Amoxicillin one month ago to treat dental infection. He feels as though the infection was not completely treated and has gotten worse. Pain is 5/10. Patient is taking Oxycodone for pain control. Has had no fevers, chills, body aches, chest pain, shortness of breath, numbness, tingling, headaches.    Past Medical History:  Diagnosis Date  . Anxiety   . Depression   . Diabetes mellitus without complication (HCC)   . Hypertension     Patient Active Problem List   Diagnosis Date Noted  . Otitis externa 02/18/2016  . Diabetes (HCC) 07/02/2015  . Hypertension 07/02/2015  . Atrial fibrillation (HCC) 07/02/2015  . Chest pain 07/02/2015    History reviewed. No pertinent surgical history.  Prior to Admission medications   Medication Sig Start Date End Date Taking? Authorizing Provider  amLODipine (NORVASC) 5 MG tablet Take 1 tablet (5 mg total) by mouth daily. 10/17/15   Janeann ForehandJames H Hawkins Jr., MD  carvedilol (COREG) 3.125 MG tablet Take 1 tablet (3.125 mg total) by mouth 2 (two) times daily with a meal. 10/17/15   Janeann ForehandJames H Hawkins Jr., MD  clindamycin (CLEOCIN) 300 MG capsule Take 1 capsule (300 mg total) by mouth 3 (three) times daily. 05/13/16   Jami L Hagler, PA-C   fluticasone (VERAMYST) 27.5 MCG/SPRAY nasal spray Place 2 sprays into the nose daily. 02/09/16   Chinita Pesterari B Triplett, FNP  hydrochlorothiazide (HYDRODIURIL) 25 MG tablet Take 1 tablet (25 mg total) by mouth daily. 05/07/16   Janeann ForehandJames H Hawkins Jr., MD  ibuprofen (ADVIL,MOTRIN) 800 MG tablet Take 1 tablet (800 mg total) by mouth every 8 (eight) hours as needed (pain). 05/13/16   Jami L Hagler, PA-C  lidocaine (XYLOCAINE) 2 % solution Use as directed 20 mLs in the mouth or throat as needed for mouth pain. 04/03/16   Evangeline Dakinharles M Beers, PA-C  lisinopril (PRINIVIL,ZESTRIL) 20 MG tablet TAKE TWO TABLETS BY MOUTH ONCE DAILY 11/01/15   Janeann ForehandJames H Hawkins Jr., MD  metFORMIN (GLUCOPHAGE) 500 MG tablet Take 1 tablet (500 mg total) by mouth 1 day or 1 dose. 10/17/15   Janeann ForehandJames H Hawkins Jr., MD    Allergies Review of patient's allergies indicates no known allergies.  Family History  Problem Relation Age of Onset  . Heart disease Mother     Social History Social History  Substance Use Topics  . Smoking status: Never Smoker  . Smokeless tobacco: Never Used  . Alcohol use No     Review of Systems Constitutional: No fever/chills ENT: Positive dental pain. No sore throat, swelling about lips/tongue/throat.  Cardiovascular: No chest pain. Respiratory: No shortness of breath.   Skin: Negative for rash. Neurological: Negative for headaches, focal weakness or numbness. 10-point ROS otherwise negative.  ____________________________________________   PHYSICAL EXAM:  VITAL SIGNS: ED Triage Vitals  Enc Vitals Group     BP 05/13/16 1953 (!) 153/101     Pulse Rate 05/13/16 1953 85     Resp 05/13/16 1953 18     Temp 05/13/16 1953 98.3 F (36.8 C)     Temp Source 05/13/16 1953 Oral     SpO2 05/13/16 1953 98 %     Weight 05/13/16 1953 270 lb (122.5 kg)     Height 05/13/16 1953 6' (1.829 m)     Head Circumference --      Peak Flow --      Pain Score 05/13/16 1954 5     Pain Loc --      Pain Edu? --      Excl. in  GC? --      Constitutional: Alert and oriented. Well appearing and in no acute distress. Eyes: Conjunctivae are normal.  Head: Atraumatic. ENT:      Mouth/Throat: Mucous membranes are moist. Diffuse broken teeth with diffuse cavities/decay. Mild erythema and swelling about the lower, right anterior teeth.  No oozing or weeping visualized. Pharynx without erythema, swelling, exudate. Uvula is midline. Airway is patent.  Neck: Supple with FROM.  Hematological/Lymphatic/Immunilogical: No cervical lymphadenopathy. Cardiovascular:  Good peripheral circulation. Respiratory: Normal respiratory effort without tachypnea or retractions. Neurologic:  Normal speech and language. No gross focal neurologic deficits are appreciated.  Skin:  Skin is warm, dry and intact. No rash noted. Psychiatric: Mood and affect are normal. Speech and behavior are normal. Patient exhibits appropriate insight and judgement.   ____________________________________________   LABS  None ____________________________________________  EKG  None ____________________________________________  RADIOLOGY  None ____________________________________________    PROCEDURES  Procedure(s) performed: None   Procedures   Medications - No data to display   ____________________________________________   INITIAL IMPRESSION / ASSESSMENT AND PLAN / ED COURSE  Pertinent labs & imaging results that were available during my care of the patient were reviewed by me and considered in my medical decision making (see chart for details).  Clinical Course    Patient's diagnosis is consistent with infected dental caries causing dental pain. Patient will be discharged home with prescriptions for clindamycin and ibuprofen to take as directed. Patient [facility] [place] [facility] for [facility]. Patient is given ED precautions to return to the ED for any  worsening or new symptoms.    ____________________________________________  FINAL CLINICAL IMPRESSION(S) / ED DIAGNOSES  Final diagnoses:  Infected dental caries  Pain, dental      NEW MEDICATIONS STARTED DURING THIS VISIT:  Discharge Medication List as of 05/13/2016  8:12 PM    START taking these medications   Details  clindamycin (CLEOCIN) 300 MG capsule Take 1 capsule (300 mg total) by mouth 3 (three) times daily., Starting Wed 05/13/2016, Print    ibuprofen (ADVIL,MOTRIN) 800 MG tablet Take 1 tablet (800 mg total) by mouth every 8 (eight) hours as needed (pain)., Starting Wed 05/13/2016, Print             Ernestene KielJami L Salton Sea BeachHagler, PA-C 05/13/16 2041    Arnaldo NatalPaul F Malinda, MD 05/14/16 [phone]

## 2016-07-28 ENCOUNTER — Telehealth: Payer: Self-pay | Admitting: Family Medicine

## 2016-07-28 ENCOUNTER — Other Ambulatory Visit: Payer: Self-pay | Admitting: Family Medicine

## 2016-07-28 MED ORDER — LISINOPRIL 20 MG PO TABS: 40.0000 mg | ORAL_TABLET | Freq: Every day | ORAL | 0 refills | Status: DC

## 2016-07-28 NOTE — Telephone Encounter (Signed)
Pt needs a refill on lisinopril 20 mg sent to [facility] [place].  His call back number is [phone]

## 2016-07-28 NOTE — Telephone Encounter (Signed)
Done.  Needs office visit appointment to cont. to get meds.-jh

## 2016-07-29 ENCOUNTER — Other Ambulatory Visit: Payer: Self-pay | Admitting: Family Medicine

## 2016-07-29 DIAGNOSIS — I10 Essential (primary) hypertension: Secondary | ICD-10-CM

## 2016-09-01 ENCOUNTER — Ambulatory Visit: Payer: Self-pay | Admitting: Family Medicine

## 2016-09-14 ENCOUNTER — Ambulatory Visit: Payer: Self-pay | Admitting: Family Medicine

## 2016-10-06 ENCOUNTER — Telehealth: Payer: Self-pay | Admitting: Family Medicine

## 2016-10-06 NOTE — Telephone Encounter (Signed)
He needs to be seen in office re: his DM< and HBP before the Viagra can be refilled..-jh

## 2016-10-06 NOTE — Telephone Encounter (Signed)
Patient aware.[facility] 

## 2016-10-06 NOTE — Telephone Encounter (Signed)
[facility] [place]  His call back number is [phone]

## 2016-10-12 ENCOUNTER — Encounter: Payer: Self-pay | Admitting: Family Medicine

## 2016-10-12 ENCOUNTER — Ambulatory Visit (INDEPENDENT_AMBULATORY_CARE_PROVIDER_SITE_OTHER): Payer: Self-pay | Admitting: Family Medicine

## 2016-10-12 ENCOUNTER — Encounter: Payer: Self-pay | Admitting: *Deleted

## 2016-10-12 VITALS — BP 120/80 | HR 62 | Temp 97.9°F | Resp 16 | Ht 72.0 in | Wt 274.0 lb

## 2016-10-12 DIAGNOSIS — L918 Other hypertrophic disorders of the skin: Secondary | ICD-10-CM

## 2016-10-12 DIAGNOSIS — E0800 Diabetes mellitus due to underlying condition with hyperosmolarity without nonketotic hyperglycemic-hyperosmolar coma (NKHHC): Secondary | ICD-10-CM

## 2016-10-12 DIAGNOSIS — E119 Type 2 diabetes mellitus without complications: Secondary | ICD-10-CM

## 2016-10-12 DIAGNOSIS — I10 Essential (primary) hypertension: Secondary | ICD-10-CM

## 2016-10-12 LAB — POCT GLYCOSYLATED HEMOGLOBIN (HGB A1C): Hemoglobin A1C: 10.2

## 2016-10-12 MED ORDER — SILDENAFIL CITRATE 20 MG PO TABS: ORAL_TABLET | ORAL | 12 refills | Status: DC

## 2016-10-12 MED ORDER — LISINOPRIL 20 MG PO TABS: 20.0000 mg | ORAL_TABLET | Freq: Every day | ORAL | 12 refills | Status: DC

## 2016-10-12 MED ORDER — HYDROCHLOROTHIAZIDE 25 MG PO TABS: 25.0000 mg | ORAL_TABLET | Freq: Every day | ORAL | 12 refills | Status: DC

## 2016-10-12 MED ORDER — CARVEDILOL 3.125 MG PO TABS: 3.1250 mg | ORAL_TABLET | Freq: Two times a day (BID) | ORAL | 12 refills | Status: DC

## 2016-10-12 MED ORDER — METFORMIN HCL 500 MG PO TABS: 500.0000 mg | ORAL_TABLET | Freq: Two times a day (BID) | ORAL | 12 refills | Status: DC

## 2016-10-12 MED ORDER — AMLODIPINE BESYLATE 5 MG PO TABS: 5.0000 mg | ORAL_TABLET | Freq: Every day | ORAL | 12 refills | Status: DC

## 2016-10-12 NOTE — Progress Notes (Signed)
Name: George Colon   MRN: [id number]    DOB: 1965/10/06   Date:10/12/2016       Progress Note  Subjective  Chief Complaint  Chief Complaint  Patient presents with  . Hypertension  . Diabetes    HPI Here for f/u of HBP and DM.  He is not taking his Metformin.  Said that he did not feel that he needed it. He is taking all BP meds.  He usually runs 145-150/90.    No problem-specific Assessment & Plan notes found for this encounter.   Past Medical History:  Diagnosis Date  . Anxiety   . Depression   . Diabetes mellitus without complication (HCC)   . Hypertension     History reviewed. No pertinent surgical history.  Family History  Problem Relation Age of Onset  . Heart disease Mother     Social History   Social History  . Marital status: Married    Spouse name: N/A  . Number of children: N/A  . Years of education: N/A   Occupational History  . Not on file.   Social History Main Topics  . Smoking status: Never Smoker  . Smokeless tobacco: Never Used  . Alcohol use No  . Drug use: No  . Sexual activity: Not on file   Other Topics Concern  . Not on file   Social History Narrative  . No narrative on file     Current Outpatient Prescriptions:  .  amLODipine (NORVASC) 5 MG tablet, Take 1 tablet (5 mg total) by mouth daily., Disp: 30 tablet, Rfl: 12 .  carvedilol (COREG) 3.125 MG tablet, Take 1 tablet (3.125 mg total) by mouth 2 (two) times daily with a meal., Disp: 60 tablet, Rfl: 12 .  hydrochlorothiazide (HYDRODIURIL) 25 MG tablet, Take 1 tablet (25 mg total) by mouth daily., Disp: 30 tablet, Rfl: 12 .  lisinopril (PRINIVIL,ZESTRIL) 20 MG tablet, Take 1 tablet (20 mg total) by mouth daily., Disp: 30 tablet, Rfl: 12 .  metFORMIN (GLUCOPHAGE) 500 MG tablet, Take 1 tablet (500 mg total) by mouth 2 (two) times daily with a meal., Disp: 60 tablet, Rfl: 12 .  sildenafil (REVATIO) 20 MG tablet, Take 1-5 tablets as needed before sex., Disp: 50 tablet, Rfl:  12  Not on File   Review of Systems  Constitutional: Negative for chills, fever, malaise/fatigue and weight loss.  HENT: Negative for hearing loss and tinnitus.   Eyes: Negative for blurred vision and double vision.  Respiratory: Negative for cough, hemoptysis, shortness of breath and wheezing.   Cardiovascular: Negative for chest pain, palpitations and leg swelling.  Gastrointestinal: Negative for abdominal pain, blood in stool and heartburn.  Genitourinary: Negative for dysuria, frequency and urgency.       + ED  Musculoskeletal: Negative for joint pain and myalgias.  Skin: Negative for rash.  Neurological: Negative for tingling, tremors, sensory change, weakness and headaches.      Objective  Vitals:   10/12/16 1004 10/12/16 1026  BP: 136/87 120/80  Pulse: 62   Resp: 16   Temp: 97.9 F (36.6 C)   TempSrc: Oral   Weight: 274 lb (124.3 kg)   Height: 6' (1.829 m)     Physical Exam  Constitutional: He is oriented to person, place, and time and well-developed, well-nourished, and in no distress. No distress.  HENT:  Head: Normocephalic and atraumatic.  Eyes: Conjunctivae and EOM are normal. Pupils are equal, round, and reactive to light. No scleral icterus.  Neck:  Normal range of motion. Neck supple. Carotid bruit is not present. No thyromegaly present.  Cardiovascular: Normal rate, regular rhythm and normal heart sounds.  Exam reveals no gallop and no friction rub.   No murmur heard. Pulmonary/Chest: Effort normal and breath sounds normal. No respiratory distress. He has no wheezes. He has no rales.  Abdominal: Soft. Bowel sounds are normal. He exhibits no distension and no mass. There is no tenderness.  Musculoskeletal: He exhibits no edema.  Lymphadenopathy:    He has no cervical adenopathy.  Neurological: He is alert and oriented to person, place, and time.  Vitals reviewed.      Recent Results (from the past 2160 hour(s))  POCT HgB A1C     Status: Abnormal    Collection Time: 10/12/16 10:24 AM  Result Value Ref Range   Hemoglobin A1C 10.2%      Assessment & Plan  Problem List Items Addressed This Visit      Cardiovascular and Mediastinum   HTN (hypertension)   Relevant Medications   lisinopril (PRINIVIL,ZESTRIL) 20 MG tablet   hydrochlorothiazide (HYDRODIURIL) 25 MG tablet   amLODipine (NORVASC) 5 MG tablet   carvedilol (COREG) 3.125 MG tablet   sildenafil (REVATIO) 20 MG tablet     Endocrine   Diabetes (HCC)   Relevant Medications   lisinopril (PRINIVIL,ZESTRIL) 20 MG tablet   metFORMIN (GLUCOPHAGE) 500 MG tablet    Other Visit Diagnoses    Diabetes mellitus without complication (HCC)    -  Primary   Relevant Medications   lisinopril (PRINIVIL,ZESTRIL) 20 MG tablet   metFORMIN (GLUCOPHAGE) 500 MG tablet   Other Relevant Orders   POCT HgB A1C (Completed)   Cutaneous skin tags       Relevant Orders   Ambulatory referral to Dermatology      Meds ordered this encounter  Medications  . lisinopril (PRINIVIL,ZESTRIL) 20 MG tablet    Sig: Take 1 tablet (20 mg total) by mouth daily.    Dispense:  30 tablet    Refill:  12    Patient needs office appointment to continue to get more meds.-jh  . hydrochlorothiazide (HYDRODIURIL) 25 MG tablet    Sig: Take 1 tablet (25 mg total) by mouth daily.    Dispense:  30 tablet    Refill:  12  . amLODipine (NORVASC) 5 MG tablet    Sig: Take 1 tablet (5 mg total) by mouth daily.    Dispense:  30 tablet    Refill:  12  . carvedilol (COREG) 3.125 MG tablet    Sig: Take 1 tablet (3.125 mg total) by mouth 2 (two) times daily with a meal.    Dispense:  60 tablet    Refill:  12  . metFORMIN (GLUCOPHAGE) 500 MG tablet    Sig: Take 1 tablet (500 mg total) by mouth 2 (two) times daily with a meal.    Dispense:  60 tablet    Refill:  12  . sildenafil (REVATIO) 20 MG tablet    Sig: Take 1-5 tablets as needed before sex.    Dispense:  50 tablet    Refill:  12   1. Diabetes mellitus without  complication (HCC)  - POCT HgB A1C-10.2  2. Essential hypertension  - lisinopril (PRINIVIL,ZESTRIL) 20 MG tablet; Take 1 tablet (20 mg total) by mouth daily.  Dispense: 30 tablet; Refill: 12 - hydrochlorothiazide (HYDRODIURIL) 25 MG tablet; Take 1 tablet (25 mg total) by mouth daily.  Dispense: 30 tablet; Refill: 12 -  amLODipine (NORVASC) 5 MG tablet; Take 1 tablet (5 mg total) by mouth daily.  Dispense: 30 tablet; Refill: 12 - carvedilol (COREG) 3.125 MG tablet; Take 1 tablet (3.125 mg total) by mouth 2 (two) times daily with a meal.  Dispense: 60 tablet; Refill: 12  3. Diabetes mellitus due to underlying condition with hyperosmolarity without coma, without long-term current use of insulin (HCC)   4. Type 2 diabetes mellitus without complication, without long-term current use of insulin (HCC)  - metFORMIN (GLUCOPHAGE) 500 MG tablet; Take 1 tablet (500 mg total) by mouth 2 (two) times daily with a meal.  Dispense: 60 tablet; Refill: 12-restart  5. Cutaneous skin tags  - Ambulatory referral to Dermatology

## 2016-10-21 ENCOUNTER — Encounter: Payer: Self-pay | Admitting: *Deleted

## 2017-01-27 ENCOUNTER — Telehealth: Payer: Self-pay | Admitting: Family Medicine

## 2017-01-27 NOTE — Telephone Encounter (Signed)
Pt's BP has been running around 150/90.  He was taken off the atenolol but that seemed to work for him.   He is selfpay and has a large balance and would rather wait to schedule but he will come in but is unable to pay at this time.  He is at work right now and can't answer phone so he asked for a text if possible.  His call back number is [phone]

## 2017-01-27 NOTE — Telephone Encounter (Signed)
Pt called regarding having high B/P wants to switch med's suggested to schedule appointment billing policy were explained to patient and he agrees has appointment wit Lauren tomorrow around 4:00 pm

## 2017-01-28 ENCOUNTER — Encounter: Payer: Self-pay | Admitting: Nurse Practitioner

## 2017-01-28 ENCOUNTER — Ambulatory Visit (INDEPENDENT_AMBULATORY_CARE_PROVIDER_SITE_OTHER): Payer: Self-pay | Admitting: Nurse Practitioner

## 2017-01-28 VITALS — BP 110/58 | HR 72 | Temp 97.8°F | Resp 16 | Ht 72.0 in | Wt 284.0 lb

## 2017-01-28 DIAGNOSIS — E1143 Type 2 diabetes mellitus with diabetic autonomic (poly)neuropathy: Secondary | ICD-10-CM

## 2017-01-28 DIAGNOSIS — I10 Essential (primary) hypertension: Secondary | ICD-10-CM

## 2017-01-28 MED ORDER — AMLODIPINE BESYLATE 10 MG PO TABS: 10.0000 mg | ORAL_TABLET | Freq: Every day | ORAL | 5 refills | Status: DC

## 2017-01-28 MED ORDER — CARVEDILOL 3.125 MG PO TABS: 3.1250 mg | ORAL_TABLET | Freq: Two times a day (BID) | ORAL | 12 refills | Status: DC

## 2017-01-28 MED ORDER — LISINOPRIL 20 MG PO TABS: 20.0000 mg | ORAL_TABLET | Freq: Every day | ORAL | 12 refills | Status: DC

## 2017-01-28 NOTE — Progress Notes (Signed)
Subjective:    Patient ID: George Colon, male    DOB: [birth year], 51 y.o.   MRN: [id number]  George Colon is a 51 y.o. male presenting on 01/28/2017 for Hypertension and Diabetes   HPI Hypertension He is taking BP readings at home.  BP readings at home are on an "old machine that is reading high."  On this machine his BP readings are between 150-170/90s and he is getting similar readings when he checks it at [facility].  He is taking his medications daily and currently takes lisinopril 20 mg once daily, amlodipine 5 mg once daily, and carvedilol 3.125 mg twice daily.  He is not taking HCTZ which is on his current list of medications.  He was particularly worried about his BP last night and took extra doses of his lisinopril and amlodipine with his carvedilol dose last night.  He notes he is having chest tightness when he wakes up and thinks he has sleep apnea.    Pt denies regular daytime headache, lightheadedness, dizziness, changes in vision, chest tightness during the day, chest pain/pressure, palpitations, sudden loss of speech or loss of consiousness.  He notes a significant family hx of heart problems, a stressful job where he works 9 hours without breaks or lunch.  He takes care of 2 children, 1 w/ autism.  All of these provide stress to Mr. Katrinka Blazing which he believes raises his BP.  He wants to care for himself but states  "I know something is wrong and I can't do anything about it right now" because of being uninsured and having other financial responsibilities that prevent cash payment.    Suspected Sleep Apnea This week he has placed his bed at an incline and it has helped his chest tightness and severity of headaches.  When he wakes up with headaches, his BP is higher with readings of 170/105 on his home machine.  He checks them later and notes that his BP is lower. He notes he cannot afford a cpap machine or a sleep study.  He wakes up with chest tightness, but does not gasp for air.   Goes to bed at 2100 wakes up at 4am. Further history for sleep apnea difficult to obtain r/t distractions with his children present in the room who are noisy and unruly.  Diabetes Mellitus He has a meter at home and has checked his CBG.  He only remembers one CBG that was checked1 hour after eating with a value of 150 mg/dL.  His last A1c was checked on Still taking Metformin, but only when he is eating.  He states he has made changes in diet to make it healthier. Baked chicken, scrambled eggs for proteins Avoids fried foods  Pt denies polydipsia, polyphagia, polyuria, headaches, diaphoresis, shakiness, chills, pain, numbness or in extremities, and changes in vision. Had tingling in feet for a couple years but it feels better now.   Social History  Substance Use Topics  . Smoking status: Never Smoker  . Smokeless tobacco: Never Used  . Alcohol use No    Review of Systems Per HPI unless specifically indicated above     Objective:    BP (!) 110/58 (BP Location: Left Arm, Patient Position: Sitting, Cuff Size: Large)   Pulse 72   Temp 97.8 F (36.6 C)   Resp 16   Ht 6' (1.829 m)   Wt 284 lb (128.8 kg)   SpO2 98%   BMI 38.52 kg/m   Wt Readings from Last  3 Encounters:  01/28/17 284 lb (128.8 kg)  10/12/16 274 lb (124.3 kg)  05/13/16 270 lb (122.5 kg)    Physical Exam  Constitutional: He is oriented to person, place, and time. He appears well-developed and well-nourished. No distress.  HENT:  Head: Normocephalic and atraumatic.  Mouth/Throat: Oropharynx is clear and moist.  Eyes: Conjunctivae and EOM are normal. Pupils are equal, round, and reactive to light.  Fundoscopic exam:      The right eye shows no arteriolar narrowing, no AV nicking, no hemorrhage and no papilledema. The right eye shows red reflex.       The left eye shows no arteriolar narrowing, no AV nicking, no hemorrhage and no papilledema. The left eye shows red reflex.  Neck: Normal range of motion. Neck  supple. No JVD present. No tracheal deviation present.  Cardiovascular: Normal rate, regular rhythm, normal heart sounds and intact distal pulses.   Pulmonary/Chest: Effort normal and breath sounds normal.  Abdominal: Soft. Bowel sounds are normal. He exhibits no distension. There is no tenderness.  Musculoskeletal:  Heberden's and bouchard's nodes bilateral hands all digits  Lymphadenopathy:    He has no cervical adenopathy.  Neurological: He is alert and oriented to person, place, and time.  Skin: Skin is warm and dry.  Psychiatric: He has a normal mood and affect. His behavior is normal.   Results for orders placed or performed in visit on 01/28/17  POCT HgB A1C  Result Value Ref Range   Hemoglobin A1C 6.7       Assessment & Plan:   Problem List Items Addressed This Visit      Cardiovascular and Mediastinum   HTN (hypertension)    Uncontrolled.  Tolerating medications well without side effects.  However, no longer taking hydrochlorothiazide as directed.  Plan: 1. CONTINUE carvedilol 3.125 mg tablet twice daily with a meal 2. CONTINUE lisinopril 20 mg take 1 tablet daily 3. STOP amlodipine 5 mg tablet and START amlodipine 10 mg take 1 tablet daily.   4. Check BP at home.  BP readings are likely accurate since they are similar to store machines.  Improved BP today is likely r/t self-increased medications. 5. Recommended labs.  Pt needs to return for fasting labs, but may refuse r/t cost.      Relevant Medications   carvedilol (COREG) 3.125 MG tablet   lisinopril (PRINIVIL,ZESTRIL) 20 MG tablet   amLODipine (NORVASC) 10 MG tablet   Other Relevant Orders   COMPLETE METABOLIC PANEL WITH GFR   Lipid panel     Endocrine   Diabetes (HCC) - Primary    Controlled and at goal per POCT HgbA1c = 6.7  Plan: 1. Continue metformin 1000 mg twice daily 2. Continue eating a low carb diet. 3. Consider starting a statin.  Need lipid panel.      Relevant Medications   lisinopril  (PRINIVIL,ZESTRIL) 20 MG tablet   Other Relevant Orders   POCT HgB A1C (Completed)   COMPLETE METABOLIC PANEL WITH GFR   Lipid panel      Meds ordered this encounter  Medications  . carvedilol (COREG) 3.125 MG tablet    Sig: Take 1 tablet (3.125 mg total) by mouth 2 (two) times daily with a meal.    Dispense:  60 tablet    Refill:  12    Order Specific Question:   Supervising Provider    Answer:   Smitty CordsKARAMALEGOS, ALEXANDER J [[id number]]  . lisinopril (PRINIVIL,ZESTRIL) 20 MG tablet    Sig: Take  1 tablet (20 mg total) by mouth daily.    Dispense:  30 tablet    Refill:  12    Patient needs office appointment to continue to get more meds.-jh    Order Specific Question:   Supervising Provider    Answer:   Smitty Cords [[id number]]  . amLODipine (NORVASC) 10 MG tablet    Sig: Take 1 tablet (10 mg total) by mouth daily.    Dispense:  30 tablet    Refill:  5    Order Specific Question:   Supervising Provider    Answer:   Smitty Cords [[id number]]      Follow up plan: Return in about 3 months (around 04/30/2017) for BP, HTN, possible sleep apnea evaluation.   [name], DNP, AGPCNP-BC Adult Gerontology Primary Care Nurse Practitioner [facility] 01/30/2017, 6:18 PM

## 2017-01-28 NOTE — Patient Instructions (Signed)
George Colon, Thank you for coming in to clinic today.  1. For your blood pressure: - CONTINUE your lisinopril 20 mg once daily - CONTINUE your carvedilol 3.25 mg one tablet twice daily. - INCREASE amlodipine to 10 mg tablet one tablet daily.  Check your BP at home and keep a log.   2. For your diabetes: - CONTINUE taking metformin 500 mg one tablet twice per day.   Please schedule a follow-up appointment with Wilhelmina McardleLauren Catheline Hixon, AGNP in 3 months.  If you have any other questions or concerns, please feel free to call the clinic or send a message through MyChart. You may also schedule an earlier appointment if necessary.  Wilhelmina McardleLauren Torrian Canion, DNP, AGNP-BC Adult Gerontology Nurse Practitioner [facility]   DASH Eating Plan DASH stands for "Dietary Approaches to Stop Hypertension." The DASH eating plan is a healthy eating plan that has been shown to reduce high blood pressure (hypertension). It may also reduce your risk for type 2 diabetes, heart disease, and stroke. The DASH eating plan may also help with weight loss. What are tips for following this plan? General guidelines   Avoid eating more than 2,300 mg (milligrams) of salt (sodium) a day. If you have hypertension, you may need to reduce your sodium intake to 1,500 mg a day.  Limit alcohol intake to no more than 1 drink a day for nonpregnant women and 2 drinks a day for men. One drink equals 12 oz of beer, 5 oz of wine, or 1 oz of hard liquor.  Work with your health care provider to maintain a healthy body weight or to lose weight. Ask what an ideal weight is for you.  Get at least 30 minutes of exercise that causes your heart to beat faster (aerobic exercise) most days of the week. Activities may include walking, swimming, or biking.  Work with your health care provider or diet and nutrition specialist (dietitian) to adjust your eating plan to your individual calorie needs. Reading food labels   Check food labels  for the amount of sodium per serving. Choose foods with less than 5 percent of the Daily Value of sodium. Generally, foods with less than 300 mg of sodium per serving fit into this eating plan.  To find whole grains, look for the word "whole" as the first word in the ingredient list. Shopping   Buy products labeled as "low-sodium" or "no salt added."  Buy fresh foods. Avoid canned foods and premade or frozen meals. Cooking   Avoid adding salt when cooking. Use salt-free seasonings or herbs instead of table salt or sea salt. Check with your health care provider or pharmacist before using salt substitutes.  Do not fry foods. Cook foods using healthy methods such as baking, boiling, grilling, and broiling instead.  Cook with heart-healthy oils, such as olive, canola, soybean, or sunflower oil. Meal planning    Eat a balanced diet that includes:  5 or more servings of fruits and vegetables each day. At each meal, try to fill half of your plate with fruits and vegetables.  Up to 6-8 servings of whole grains each day.  Less than 6 oz of lean meat, poultry, or fish each day. A 3-oz serving of meat is about the same size as a deck of cards. One egg equals 1 oz.  2 servings of low-fat dairy each day.  A serving of nuts, seeds, or beans 5 times each week.  Heart-healthy fats. Healthy fats called Omega-3 fatty acids are  found in foods such as flaxseeds and coldwater fish, like sardines, salmon, and mackerel.  Limit how much you eat of the following:  Canned or prepackaged foods.  Food that is high in trans fat, such as fried foods.  Food that is high in saturated fat, such as fatty meat.  Sweets, desserts, sugary drinks, and other foods with added sugar.  Full-fat dairy products.  Do not salt foods before eating.  Try to eat at least 2 vegetarian meals each week.  Eat more home-cooked food and less restaurant, buffet, and fast food.  When eating at a restaurant, ask that your  food be prepared with less salt or no salt, if possible. What foods are recommended? The items listed may not be a complete list. Talk with your dietitian about what dietary choices are best for you. Grains  Whole-grain or whole-wheat bread. Whole-grain or whole-wheat pasta. Brown rice. Modena Morrow. Bulgur. Whole-grain and low-sodium cereals. Pita bread. Low-fat, low-sodium crackers. Whole-wheat flour tortillas. Vegetables  Fresh or frozen vegetables (raw, steamed, roasted, or grilled). Low-sodium or reduced-sodium tomato and vegetable juice. Low-sodium or reduced-sodium tomato sauce and tomato paste. Low-sodium or reduced-sodium canned vegetables. Fruits  All fresh, dried, or frozen fruit. Canned fruit in natural juice (without added sugar). Meat and other protein foods  Skinless chicken or [place]. Ground chicken or [place]. Pork with fat trimmed off. Fish and seafood. Egg whites. Dried beans, peas, or lentils. Unsalted nuts, nut butters, and seeds. Unsalted canned beans. Lean cuts of beef with fat trimmed off. Low-sodium, lean deli meat. Dairy  Low-fat (1%) or fat-free (skim) milk. Fat-free, low-fat, or reduced-fat cheeses. Nonfat, low-sodium ricotta or cottage cheese. Low-fat or nonfat yogurt. Low-fat, low-sodium cheese. Fats and oils  Soft margarine without trans fats. Vegetable oil. Low-fat, reduced-fat, or light mayonnaise and salad dressings (reduced-sodium). Canola, safflower, olive, soybean, and sunflower oils. Avocado. Seasoning and other foods  Herbs. Spices. Seasoning mixes without salt. Unsalted popcorn and pretzels. Fat-free sweets. What foods are not recommended? The items listed may not be a complete list. Talk with your dietitian about what dietary choices are best for you. Grains  Baked goods made with fat, such as croissants, muffins, or some breads. Dry pasta or rice meal packs. Vegetables  Creamed or fried vegetables. Vegetables in a cheese sauce. Regular canned  vegetables (not low-sodium or reduced-sodium). Regular canned tomato sauce and paste (not low-sodium or reduced-sodium). Regular tomato and vegetable juice (not low-sodium or reduced-sodium). Angie Fava. Olives. Fruits  Canned fruit in a light or heavy syrup. Fried fruit. Fruit in cream or butter sauce. Meat and other protein foods  Fatty cuts of meat. Ribs. Fried meat. Berniece Salines. Sausage. Bologna and other processed lunch meats. Salami. Fatback. Hotdogs. Bratwurst. Salted nuts and seeds. Canned beans with added salt. Canned or smoked fish. Whole eggs or egg yolks. Chicken or [place] with skin. Dairy  Whole or 2% milk, cream, and half-and-half. Whole or full-fat cream cheese. Whole-fat or sweetened yogurt. Full-fat cheese. Nondairy creamers. Whipped toppings. Processed cheese and cheese spreads. Fats and oils  Butter. Stick margarine. Lard. Shortening. Ghee. Bacon fat. Tropical oils, such as coconut, palm kernel, or palm oil. Seasoning and other foods  Salted popcorn and pretzels. Onion salt, garlic salt, seasoned salt, table salt, and sea salt. Worcestershire sauce. Tartar sauce. Barbecue sauce. Teriyaki sauce. Soy sauce, including reduced-sodium. Steak sauce. Canned and packaged gravies. Fish sauce. Oyster sauce. Cocktail sauce. Horseradish that you find on the shelf. Ketchup. Mustard. Meat flavorings and tenderizers. Bouillon cubes.  Hot sauce and Tabasco sauce. Premade or packaged marinades. Premade or packaged taco seasonings. Relishes. Regular salad dressings. Where to find more information:  National Heart, Lung, and [place]: https://wilson-eaton.com/  American Heart Association: www.heart.org Summary  The DASH eating plan is a healthy eating plan that has been shown to reduce high blood pressure (hypertension). It may also reduce your risk for type 2 diabetes, heart disease, and stroke.  With the DASH eating plan, you should limit salt (sodium) intake to 2,300 mg a day. If you have hypertension,  you may need to reduce your sodium intake to 1,500 mg a day.  When on the DASH eating plan, aim to eat more fresh fruits and vegetables, whole grains, lean proteins, low-fat dairy, and heart-healthy fats.  Work with your health care provider or diet and nutrition specialist (dietitian) to adjust your eating plan to your individual calorie needs. This information is not intended to replace advice given to you by your health care provider. Make sure you discuss any questions you have with your health care provider. Document Released: 09/03/2011 Document Revised: 09/07/2016 Document Reviewed: 09/07/2016 Elsevier Interactive Patient Education  2017 [employer].

## 2017-01-29 LAB — POCT GLYCOSYLATED HEMOGLOBIN (HGB A1C): Hemoglobin A1C: 6.7

## 2017-01-30 NOTE — Assessment & Plan Note (Signed)
Uncontrolled.  Tolerating medications well without side effects.  However, no longer taking hydrochlorothiazide as directed.  Plan: 1. CONTINUE carvedilol 3.125 mg tablet twice daily with a meal 2. CONTINUE lisinopril 20 mg take 1 tablet daily 3. STOP amlodipine 5 mg tablet and START amlodipine 10 mg take 1 tablet daily.   4. Check BP at home.  BP readings are likely accurate since they are similar to store machines.  Improved BP today is likely r/t self-increased medications. 5. Recommended labs.  Pt needs to return for fasting labs, but may refuse r/t cost.

## 2017-01-30 NOTE — Assessment & Plan Note (Signed)
Controlled and at goal per POCT HgbA1c = 6.7  Plan: 1. Continue metformin 1000 mg twice daily 2. Continue eating a low carb diet. 3. Consider starting a statin.  Need lipid panel.

## 2017-01-31 NOTE — Progress Notes (Signed)
I have reviewed this encounter including the documentation in this note and/or discussed this patient with the provider, Wilhelmina McardleLauren Kennedy, AGPCNP-BC. I am certifying that I agree with the content of this note as supervising physician.  Saralyn PilarAlexander Karamalegos, DO [facility] 01/31/2017, 10:02 AM

## 2017-03-16 ENCOUNTER — Encounter: Payer: Self-pay | Admitting: Nurse Practitioner

## 2017-03-16 ENCOUNTER — Ambulatory Visit (INDEPENDENT_AMBULATORY_CARE_PROVIDER_SITE_OTHER): Payer: Self-pay | Admitting: Nurse Practitioner

## 2017-03-16 VITALS — BP 132/87 | HR 73 | Temp 98.1°F | Ht 72.0 in | Wt 288.4 lb

## 2017-03-16 DIAGNOSIS — N451 Epididymitis: Secondary | ICD-10-CM

## 2017-03-16 DIAGNOSIS — I10 Essential (primary) hypertension: Secondary | ICD-10-CM

## 2017-03-16 DIAGNOSIS — E119 Type 2 diabetes mellitus without complications: Secondary | ICD-10-CM

## 2017-03-16 MED ORDER — METFORMIN HCL 500 MG PO TABS: 500.0000 mg | ORAL_TABLET | Freq: Two times a day (BID) | ORAL | 12 refills | Status: DC

## 2017-03-16 MED ORDER — NAPROXEN 500 MG PO TABS: 500.0000 mg | ORAL_TABLET | Freq: Two times a day (BID) | ORAL | 0 refills | Status: DC

## 2017-03-16 MED ORDER — LISINOPRIL-HYDROCHLOROTHIAZIDE 20-12.5 MG PO TABS: 1.0000 | ORAL_TABLET | Freq: Every day | ORAL | 3 refills | Status: DC

## 2017-03-16 NOTE — Progress Notes (Signed)
Subjective:    Patient ID: George Colon, male    DOB: 08-May-1966, 51 y.o.   MRN: [id number]  George Colon is a 51 y.o. male presenting on 03/16/2017 for Groin Swelling (Right testicle)   HPI  Swelling R testicle Started about 1 month ago.  Pt notes swelling is behind testicle and only feels it when he is standing.  Examining scrotum seems to have irritated it as it has continued to swell.  He wears loose fitting boxers.  Pt states it feels like a ridge, straw lying down.   No increased pain with ejaculation.  Reduced volume, difficult to obtain erection even with sildenafil,   Hypertension Is taking amlodipine, carvedilol bid, lisinopril daily and is tolerating well w/o side effects.  Is not taking hctz.  Pt denies headache, lightheadedness, dizziness, changes in vision, chest tightness/pressure, palpitations, leg swelling, sudden loss of speech or loss of consciousness.   Diabetes Pt presents today for follow up of Type 2 diabetes mellitus. he is not checking CBG at home  Current diabetic medications include: metformin and is not currently taking because of concern for low blood sugar without eating  Is taking inconsistently or not at all. Previous A1c at goal.    He denies polydipsia, polyphagia, polyuria, headaches, diaphoresis, shakiness, chills, pain, numbness or tingling in extremities and changes in vision.  Clinical course has been unchanged.  He  reports no regular exercise routine. But is trying to increase walking every day 2 miles and gym.   His diet is high in salt, high in fat, and high in carbohydrates. Eye exam current (within one year): no Weight trend: increased by 4 lbs   Recent Labs  10/12/16 1024 01/29/17 1404  HGBA1C 10.2% 6.7    Social History  Substance Use Topics  . Smoking status: Never Smoker  . Smokeless tobacco: Never Used  . Alcohol use No    Review of Systems Per HPI unless specifically indicated above     Objective:    BP  132/87 (BP Location: Right Arm, Patient Position: Sitting, Cuff Size: Large)   Pulse 73   Temp 98.1 F (36.7 C) (Oral)   Ht 6' (1.829 m)   Wt 288 lb 6.4 oz (130.8 kg)   BMI 39.11 kg/m    Wt Readings from Last 3 Encounters:  03/16/17 288 lb 6.4 oz (130.8 kg)  01/28/17 284 lb (128.8 kg)  10/12/16 274 lb (124.3 kg)    Physical Exam  Constitutional: He is oriented to person, place, and time. He appears well-developed and well-nourished.  Obese w/ central obesity  HENT:  Head: Normocephalic and atraumatic.  Cardiovascular: Normal rate, regular rhythm and normal heart sounds.   Pulmonary/Chest: Effort normal and breath sounds normal. No respiratory distress.  Genitourinary:  Genitourinary Comments: Genital and Rectal Exam chaperoned by Laurel Dimmer, CMA Genital: penis normal shape without lesions or urethral discharge, scrotum intact without masses, Right spermatic cord palpated with edema and tenderness, Right epididymis swollen and tender. bilateral testicles descended Right testicle swollen and tender.  Left testicle normal w/o swelling or tenderness.  No inguinal hernia or lymphadenopathy.   Neurological: He is alert and oriented to person, place, and time.  Skin: Skin is warm and dry.  Psychiatric: His speech is normal and behavior is normal. His mood appears anxious.  Vitals reviewed.   Results for orders placed or performed in visit on 01/28/17  POCT HgB A1C  Result Value Ref Range   Hemoglobin A1C 6.7  Assessment & Plan:   Problem List Items Addressed This Visit      Cardiovascular and Mediastinum   HTN (hypertension)    Uncontrolled.  Tolerating medications well without side effects.  However, no longer taking hydrochlorothiazide as directed.  Plan: 1. CONTINUE carvedilol 3.125 mg tablet twice daily with a meal 2. STOP lisinopril 20 mg take 1 tablet daily and START lisinopril - HCTZ 20-12.5 mg tablet once daily. 3. CONTINUE amlodipine 10 mg take 1 tablet  daily.   4. Check BP at home.  BP readings are likely accurate since they are similar to store machines.  Improved BP today is likely r/t self-increased medications. 5. Recommended labs.  Pt needs to return for fasting labs, but may refuse r/t cost. 6. Follow up 3 months      Relevant Medications   lisinopril-hydrochlorothiazide (ZESTORETIC) 20-12.5 MG tablet   Other Relevant Orders   Comprehensive metabolic panel   Lipid panel     Endocrine   Diabetes (HCC)    Controlled and at goal per prior POCT HgbA1c = 6.7  Plan: 1. Continue metformin 1000 mg twice daily.  Education provided about mechanism of action of metformin and not required to be taken w/ meals.  Encouraged more regular meals. 2. Continue eating a low carb diet. 3. Consider starting a statin.  Need lipid panel. 4. Follow up 3 months.      Relevant Medications   metFORMIN (GLUCOPHAGE) 500 MG tablet   lisinopril-hydrochlorothiazide (ZESTORETIC) 20-12.5 MG tablet   Other Relevant Orders   Lipid panel    Other Visit Diagnoses    Epididymitis, right    -  Primary   Mildly swollen R spermatic cords, epididymis, and testicle.  Pt w/o pain at rest and no current signs of localized or active infection.  Plan: 1. Start aleeve 500 mg twice daily x 2 weeks.  2. Referral to urology highly recommended.  [facility]. 3. If not able to be seen by Urology, symptoms worsen despite NSAID x 2 weeks, consider antibiotics.        Meds ordered this encounter  Medications  . metFORMIN (GLUCOPHAGE) 500 MG tablet    Sig: Take 1 tablet (500 mg total) by mouth 2 (two) times daily with a meal.    Dispense:  60 tablet    Refill:  12    Order Specific Question:   Supervising Provider    Answer:   Smitty CordsKARAMALEGOS, ALEXANDER J [[id number]]  . lisinopril-hydrochlorothiazide (ZESTORETIC) 20-12.5 MG tablet    Sig: Take 1 tablet by mouth daily.    Dispense:  90 tablet    Refill:  3    Order  Specific Question:   Supervising Provider    Answer:   Smitty CordsKARAMALEGOS, ALEXANDER J [[id number]]  . naproxen (NAPROSYN) 500 MG tablet    Sig: Take 1 tablet (500 mg total) by mouth 2 (two) times daily with a meal.    Dispense:  60 tablet    Refill:  0    Order Specific Question:   Supervising Provider    Answer:   Smitty CordsKARAMALEGOS, ALEXANDER J [[id number]]      Follow up plan: Return 7-14 days if symptoms worsen or fail to improve.   Wilhelmina McardleLauren Emalene Welte, DNP, AGPCNP-BC Adult Gerontology Primary Care Nurse Practitioner [facility] 03/21/2017, 10:20 AM

## 2017-03-16 NOTE — Patient Instructions (Addendum)
George Colon, Thank you for coming in to clinic today.  1. You have epididymitis: - Take Aleeve or naproxen sodium two tablets twice daily for 2 weeks.  If it is resolving, you can stop taking the Aleeve.  If not resolving, you will need antibiotic.  Call the clinic for a prescription. If worsening even on Aleeve, call for the antibiotic. - We may still need to ask you to see urology.   2. Obtain labs: - You will be due for FASTING BLOOD WORK (no food or drink after midnight before, only water or coffee without cream/sugar on the morning of) For Lab Results, once available within 2-3 days of blood draw, you can can log in to MyChart online to view your results and a brief explanation. Also, we can discuss results at next follow-up visit.  CMP - draw first: Cost is: $21.99 Lipid - draw second or together: Cost is: $51.00  Please schedule a follow-up appointment with [name], AGNP to Return 7-14 days if symptoms worsen or fail to improve.  If you have any other questions or concerns, please feel free to call the clinic or send a message through MyChart. You may also schedule an earlier appointment if necessary.  [name], DNP, AGNP-BC Adult Gerontology Nurse Practitioner [facility], [facility]    Epididymitis Epididymitis is swelling (inflammation) of the epididymis. The epididymis is a cord-like structure that is located along the top and back part of the testicle. It collects and stores sperm from the testicle. This condition can also cause pain and swelling of the testicle and scrotum. Symptoms usually start suddenly (acute epididymitis). Sometimes epididymitis starts gradually and lasts for a while (chronic epididymitis). This type may be harder to treat. What are the causes? In men 20 and younger, this condition is usually caused by a bacterial infection or sexually transmitted disease (STD), such as:  Gonorrhea.  Chlamydia.  In men 88 and older who do not have  anal sex, this condition is usually caused by bacteria from a blockage or abnormalities in the urinary system. These can result from:  Having a tube placed into the bladder (urinary catheter).  Having an enlarged or inflamed prostate gland.  Having recent urinary tract surgery.  In men who have a condition that weakens the body's defense system (immune system), such as HIV, this condition can be caused by:  Other bacteria, including tuberculosis and syphilis.  Viruses.  Fungi.  Sometimes this condition occurs without infection. That may happen if urine flows backward into the epididymis after heavy lifting or straining. What increases the risk? This condition is more likely to develop in men:  Who have unprotected sex with more than one partner.  Who have anal sex.  Who have recently had surgery.  Who have a urinary catheter.  Who have urinary problems.  Who have a suppressed immune system.  What are the signs or symptoms? This condition usually begins suddenly with chills, fever, and pain behind the scrotum and in the testicle. Other symptoms include:  Swelling of the scrotum, testicle, or both.  Pain whenejaculatingor urinating.  Pain in the back or belly.  Nausea.  Itching and discharge from the penis.  Frequent need to pass urine.  Redness and tenderness of the scrotum.  How is this diagnosed? Your health care provider can diagnose this condition based on your symptoms and medical history. Your health care provider will also do a physical exam to ask about your symptoms and check your scrotum and  testicle for swelling, pain, and redness. You may also have other tests, including:  Examination of discharge from the penis.  Urine tests for infections, such as STDs.  Your health care provider may test you for other STDs, including HIV. How is this treated? Treatment for this condition depends on the cause. If your condition is caused by a bacterial  infection, oral antibiotic medicine may be prescribed. If the bacterial infection has spread to your blood, you may need to receive IV antibiotics. Nonbacterial epididymitis is treated with home care that includes bed rest and elevation of the scrotum. Surgery may be needed to treat:  Bacterial epididymitis that causes pus to build up in the scrotum (abscess).  Chronic epididymitis that has not responded to other treatments.  Follow these instructions at home: Medicines  Take over-the-counter and prescription medicines only as told by your health care provider.  If you were prescribed an antibiotic medicine, take it as told by your health care provider. Do not stop taking the antibiotic even if your condition improves. Sexual Activity  If your epididymitis was caused by an STD, avoid sexual activity until your treatment is complete.  Inform your sexual partner or partners if you test positive for an STD. They may need to be treated.Do not engage in sexual activity with your partner or partners until their treatment is completed. General instructions  Return to your normal activities as told by your health care provider. Ask your health care provider what activities are safe for you.  Keep your scrotum elevated and supported while resting. Ask your health care provider if you should wear a scrotal support, such as a jockstrap. Wear it as told by your health care provider.  If directed, apply ice to the affected area: ? Put ice in a plastic bag. ? Place a towel between your skin and the bag. ? Leave the ice on for 20 minutes, 2-3 times per day.  Try taking a sitz bath to help with discomfort. This is a warm water bath that is taken while you are sitting down. The water should only come up to your hips and should cover your buttocks. Do this 3-4 times per day or as told by your health care provider.  Keep all follow-up visits as told by your health care provider. This is  important. Contact a health care provider if:  You have a fever.  Your pain medicine is not helping.  Your pain is getting worse.  Your symptoms do not improve within three days. This information is not intended to replace advice given to you by your health care provider. Make sure you discuss any questions you have with your health care provider. Document Released: 09/11/2000 Document Revised: 02/20/2016 Document Reviewed: 01/30/2015 Elsevier Interactive Patient Education  2018 [employer]

## 2017-03-18 ENCOUNTER — Telehealth: Payer: Self-pay | Admitting: Nurse Practitioner

## 2017-03-18 DIAGNOSIS — N451 Epididymitis: Secondary | ICD-10-CM

## 2017-03-18 NOTE — Telephone Encounter (Signed)
Pt [facility] have [facility] [address].  His call back number is [phone]

## 2017-03-18 NOTE — Telephone Encounter (Signed)
We will need to wait for aleeve to resolve this before antibiotics.  Please give Aleeve at least 7 days before antibiotics or if swelling is getting worse.  I would prefer him to be seen by urology for his epididymitis.  [facility] - pt will need to proceed with charity care application.  Starting now as this can take some time.

## 2017-03-18 NOTE — Telephone Encounter (Signed)
Pt advised.

## 2017-03-18 NOTE — Addendum Note (Signed)
Addended by: Wilhelmina McardleKENNEDY, Younique Casad R on: 03/18/2017 11:31 AM   Modules accepted: Orders

## 2017-03-18 NOTE — Telephone Encounter (Signed)
Left message for patient to call back  

## 2017-03-21 NOTE — Assessment & Plan Note (Signed)
Uncontrolled.  Tolerating medications well without side effects.  However, no longer taking hydrochlorothiazide as directed.  Plan: 1. CONTINUE carvedilol 3.125 mg tablet twice daily with a meal 2. STOP lisinopril 20 mg take 1 tablet daily and START lisinopril - HCTZ 20-12.5 mg tablet once daily. 3. CONTINUE amlodipine 10 mg take 1 tablet daily.   4. Check BP at home.  BP readings are likely accurate since they are similar to store machines.  Improved BP today is likely r/t self-increased medications. 5. Recommended labs.  Pt needs to return for fasting labs, but may refuse r/t cost. 6. Follow up 3 months

## 2017-03-21 NOTE — Assessment & Plan Note (Signed)
Controlled and at goal per prior POCT HgbA1c = 6.7  Plan: 1. Continue metformin 1000 mg twice daily.  Education provided about mechanism of action of metformin and not required to be taken w/ meals.  Encouraged more regular meals. 2. Continue eating a low carb diet. 3. Consider starting a statin.  Need lipid panel. 4. Follow up 3 months.

## 2017-03-22 ENCOUNTER — Telehealth: Payer: Self-pay | Admitting: Family Medicine

## 2017-03-22 NOTE — Progress Notes (Signed)
I have reviewed this encounter including the documentation in this note and/or discussed this patient with the provider, Wilhelmina McardleLauren Kennedy, AGPCNP-BC. I am certifying that I agree with the content of this note as supervising physician.  Saralyn PilarAlexander Karamalegos, DO [facility] 03/22/2017, 1:38 PM

## 2017-03-22 NOTE — Telephone Encounter (Signed)
[facility] Anaracy with [facility] said she contacted pt and he declined appt at this time.  He is self pay and can't afford to go.

## 2017-03-23 NOTE — Telephone Encounter (Signed)
Did he proceed w/ charity care application?  That was why I sent him there.  Also let him know about 2nd wed urology at [facility].

## 2017-04-26 ENCOUNTER — Emergency Department: Payer: Self-pay

## 2017-04-26 ENCOUNTER — Emergency Department
Admission: EM | Admit: 2017-04-26 | Discharge: 2017-04-26 | Discharge status: Against Medical Advice | Payer: Self-pay | Attending: Emergency Medicine | Admitting: Emergency Medicine

## 2017-04-26 ENCOUNTER — Encounter: Payer: Self-pay | Admitting: Emergency Medicine

## 2017-04-26 DIAGNOSIS — N433 Hydrocele, unspecified: Secondary | ICD-10-CM | POA: Insufficient documentation

## 2017-04-26 DIAGNOSIS — I861 Scrotal varices: Secondary | ICD-10-CM | POA: Insufficient documentation

## 2017-04-26 DIAGNOSIS — Z7984 Long term (current) use of oral hypoglycemic drugs: Secondary | ICD-10-CM | POA: Insufficient documentation

## 2017-04-26 DIAGNOSIS — I10 Essential (primary) hypertension: Secondary | ICD-10-CM | POA: Insufficient documentation

## 2017-04-26 DIAGNOSIS — N5082 Scrotal pain: Secondary | ICD-10-CM

## 2017-04-26 DIAGNOSIS — E119 Type 2 diabetes mellitus without complications: Secondary | ICD-10-CM | POA: Insufficient documentation

## 2017-04-26 DIAGNOSIS — Z79899 Other long term (current) drug therapy: Secondary | ICD-10-CM | POA: Insufficient documentation

## 2017-04-26 DIAGNOSIS — K56609 Unspecified intestinal obstruction, unspecified as to partial versus complete obstruction: Secondary | ICD-10-CM | POA: Insufficient documentation

## 2017-04-26 LAB — COMPREHENSIVE METABOLIC PANEL
ALBUMIN: 4.4 g/dL (ref 3.5–5.0)
ALK PHOS: 56 U/L (ref 38–126)
ALT: 18 U/L (ref 17–63)
AST: 29 U/L (ref 15–41)
Anion gap: 10 (ref 5–15)
BILIRUBIN TOTAL: 1.1 mg/dL (ref 0.3–1.2)
BUN: 13 mg/dL (ref 6–20)
CALCIUM: 9 mg/dL (ref 8.9–10.3)
CO2: 23 mmol/L (ref 22–32)
CREATININE: 0.86 mg/dL (ref 0.61–1.24)
Chloride: 101 mmol/L (ref 101–111)
GFR calc Af Amer: >60 mL/min (ref >60)
GFR calc non Af Amer: >60 mL/min (ref >60)
GLUCOSE: 188 mg/dL — AB (ref 65–99)
Potassium: 4.1 mmol/L (ref 3.5–5.1)
Sodium: 134 mmol/L — ABNORMAL LOW (ref 135–145)
TOTAL PROTEIN: 7.5 g/dL (ref 6.5–8.1)

## 2017-04-26 LAB — CBC
HCT: 48 % (ref 40.0–52.0)
Hemoglobin: 16.3 g/dL (ref 13.0–18.0)
MCH: 30.3 pg (ref 26.0–34.0)
MCHC: 34 g/dL (ref 32.0–36.0)
MCV: 89 fL (ref 80.0–100.0)
PLATELETS: 178 10*3/uL (ref 150–440)
RBC: 5.39 MIL/uL (ref 4.40–5.90)
RDW: 14.3 % (ref 11.5–14.5)
WBC: 12.3 10*3/uL — ABNORMAL HIGH (ref 3.8–10.6)

## 2017-04-26 LAB — URINALYSIS, COMPLETE (UACMP) WITH MICROSCOPIC
Bacteria, UA: NONE SEEN
Bilirubin Urine: NEGATIVE
Glucose, UA: NEGATIVE mg/dL
HGB URINE DIPSTICK: NEGATIVE
Ketones, ur: 5 mg/dL — AB
Leukocytes, UA: NEGATIVE
NITRITE: NEGATIVE
Protein, ur: 30 mg/dL — AB
RBC / HPF: NONE SEEN RBC/hpf (ref 0–5)
SPECIFIC GRAVITY, URINE: 1.033 — AB (ref 1.005–1.030)
SQUAMOUS EPITHELIAL / LPF: NONE SEEN
pH: 5 (ref 5.0–8.0)

## 2017-04-26 LAB — TROPONIN I: Troponin I: <0.03 ng/mL (ref <0.03)

## 2017-04-26 LAB — LIPASE, BLOOD: Lipase: 34 U/L (ref 11–51)

## 2017-04-26 MED ORDER — SODIUM CHLORIDE 0.9 % IV BOLUS (SEPSIS)
1000.0000 mL | Freq: Once | INTRAVENOUS | Status: AC
  Administered 2017-04-26: 1000 mL via INTRAVENOUS

## 2017-04-26 MED ORDER — IOPAMIDOL (ISOVUE-300) INJECTION 61%
100.0000 mL | Freq: Once | INTRAVENOUS | Status: AC | PRN
  Administered 2017-04-26: 100 mL via INTRAVENOUS

## 2017-04-26 MED ORDER — IOPAMIDOL (ISOVUE-300) INJECTION 61%
30.0000 mL | Freq: Once | INTRAVENOUS | Status: AC | PRN
  Administered 2017-04-26: 30 mL via ORAL

## 2017-04-26 NOTE — ED Notes (Signed)
Pt states general abd pain since Saturday, states nausea, no relief with pain medications, awake and alert in no acute distress

## 2017-04-26 NOTE — ED Notes (Signed)
Patient transported to Ultrasound 

## 2017-04-26 NOTE — ED Triage Notes (Signed)
Pt reports generalized abdominal pain for 3-4 days. Pt reports was recently seen by PCP for infection in testicle but did not have any tests done. Pt took old amoxicillin but did not get the prescription filled that PCP gave him. Pt denies testicle pain at present. Denies NVD. Pt denies dysuria.

## 2017-04-26 NOTE — ED Notes (Signed)
Patient transported to CT 

## 2017-04-26 NOTE — ED Provider Notes (Signed)
[facility] Emergency Department Provider Note  ____________________________________________   First MD Initiated Contact with Patient 04/26/17 1149     (approximate)  I have reviewed the triage vital signs and the nursing notes.   HISTORY  Chief Complaint Abdominal Pain   HPI George Colon is a 51 y.o. male with a history of anxiety as well as diabetes and hypertension who is presenting to the emergency department today with periumbilical abdominal pain as well as right testicular pain. He says that he was seen at his doctor's office in mid-June for right testicular pain and swelling that was diagnosed with epididymitis. However, the patient says that he has taken several days of Naprosyn initially and then switched to amoxicillin twice a day for 2 weeks after the symptoms are not completely resolved. He said the testicular pain is been resolved over the past 2 days that he has now started to experience a squeezing her umbilical abdominal pain with one episode of vomiting. He is denying any pain or nausea at this time. Is very concerned that he may have cancer. He admits to being a "hypochondriac" and says that he is very concerned about a serious illness.   Past Medical History:  Diagnosis Date  . Anxiety   . Depression   . Diabetes mellitus without complication (HCC)   . Hypertension     Patient Active Problem List   Diagnosis Date Noted  . Diabetes (HCC) 07/02/2015  . HTN (hypertension) 07/02/2015  . Atrial fibrillation (HCC) 07/02/2015    No past surgical history on file.  Prior to Admission medications   Medication Sig Start Date End Date Taking? Authorizing Provider  amLODipine (NORVASC) 10 MG tablet Take 1 tablet (10 mg total) by mouth daily. 01/28/17  Yes Galen ManilaKennedy, Lauren Renee, NP  carvedilol (COREG) 3.125 MG tablet Take 1 tablet (3.125 mg total) by mouth 2 (two) times daily with a meal. 01/28/17  Yes Galen ManilaKennedy, Lauren Renee, NP    lisinopril-hydrochlorothiazide (ZESTORETIC) 20-12.5 MG tablet Take 1 tablet by mouth daily. 03/16/17  Yes Galen ManilaKennedy, Lauren Renee, NP  metFORMIN (GLUCOPHAGE) 500 MG tablet Take 1 tablet (500 mg total) by mouth 2 (two) times daily with a meal. Patient taking differently: Take 500 mg by mouth daily with breakfast.  03/16/17  Yes Galen ManilaKennedy, Lauren Renee, NP  naproxen sodium (ANAPROX) 220 MG tablet Take 440 mg by mouth 2 (two) times daily with a meal.   Yes [provider]  naproxen (NAPROSYN) 500 MG tablet Take 1 tablet (500 mg total) by mouth 2 (two) times daily with a meal. Patient not taking: Reported on 04/26/2017 03/16/17   Galen ManilaKennedy, Lauren Renee, NP  sildenafil (REVATIO) 20 MG tablet Take 1-5 tablets as needed before sex. Patient not taking: Reported on 03/16/2017 10/12/16   Janeann ForehandHawkins, James H Jr., MD    Allergies Patient has no known allergies.  Family History  Problem Relation Age of Onset  . Heart disease Mother     Social History Social History  Substance Use Topics  . Smoking status: Never Smoker  . Smokeless tobacco: Never Used  . Alcohol use No    Review of Systems  Constitutional: No fever/chills Eyes: No visual changes. ENT: No sore throat. Cardiovascular: Denies chest pain. Respiratory: Denies shortness of breath. Gastrointestinal:  No diarrhea.  No constipation. Genitourinary: Negative for dysuria. Musculoskeletal: Negative for back pain. Skin: Negative for rash. Neurological: Negative for headaches, focal weakness or numbness.   ____________________________________________   PHYSICAL EXAM:  VITAL  SIGNS: ED Triage Vitals [04/26/17 1020]  Enc Vitals Group     BP (!) 148/98     Pulse Rate 88     Resp 18     Temp 98.9 F (37.2 C)     Temp Source Oral     SpO2 97 %     Weight 290 lb (131.5 kg)     Height 6' (1.829 m)     Head Circumference      Peak Flow      Pain Score 5     Pain Loc      Pain Edu?      Excl. in GC?     Constitutional: Alert  and oriented. Well appearing and in no acute distress. Eyes: Conjunctivae are normal.  Head: Atraumatic. Nose: No congestion/rhinnorhea. Mouth/Throat: Mucous membranes are moist.  Neck: No stridor.   Cardiovascular: Normal rate, regular rhythm. Grossly normal heart sounds.   Respiratory: Normal respiratory effort.  No retractions. Lungs CTAB. Gastrointestinal: Soft with mild epigastric as well as left upper quadrant tenderness palpation. Negative Murphy sign. No distention. No CVA tenderness. Genitourinary: Normal gross examination of the testes and penis without any bullae or ulcerations. However, the patient has multiple skin tags which he says he has had for 30 years overlying the perineum. He denies any changes in the skin tags. No tenderness palpation or masses palpated to the scrotum. Normal lie of the bilateral testes. Patient is circumcised and there is no tenderness nor erythema or induration about the penis. There are no hernia sacs visualized or palpated to the groin nor the scrotum. Musculoskeletal: No lower extremity tenderness nor edema.  No joint effusions. Neurologic:  Normal speech and language. No gross focal neurologic deficits are appreciated. Skin:  Skin is warm, dry and intact. No rash noted. Psychiatric: Mood and affect are normal. Speech and behavior are normal.  ____________________________________________   LABS (all labs ordered are listed, but only abnormal results are displayed)  Labs Reviewed  COMPREHENSIVE METABOLIC PANEL - Abnormal; Notable for the following:       Result Value   Sodium 134 (*)    Glucose, Bld 188 (*)    All other components within normal limits  CBC - Abnormal; Notable for the following:    WBC 12.3 (*)    All other components within normal limits  URINALYSIS, COMPLETE (UACMP) WITH MICROSCOPIC - Abnormal; Notable for the following:    Color, Urine AMBER (*)    APPearance CLEAR (*)    Specific Gravity, Urine 1.033 (*)    Ketones, ur 5  (*)    Protein, ur 30 (*)    All other components within normal limits  LIPASE, BLOOD  TROPONIN I   ____________________________________________  EKG  ED ECG REPORT I, Arelia LongestSchaevitz,  Taquan Bralley M, the attending physician, personally viewed and interpreted this ECG.   Date: 04/26/2017  EKG Time: 1023  Rate: 75  Rhythm: normal sinus rhythm  Axis: Normal  Intervals:none  ST&T Change: No ST segment elevation or depression. No abnormal T-wave inversion.  ____________________________________________  RADIOLOGY  Possible partial small bowel obstruction versus ileus. Scrotal ultrasound with small right hydrocele and bilateral varicoceles. ____________________________________________   PROCEDURES  Procedure(s) performed:   Procedures  Critical Care performed:   ____________________________________________   INITIAL IMPRESSION / ASSESSMENT AND PLAN / ED COURSE  Pertinent labs & imaging results that were available during my care of the patient were reviewed by me and considered in my medical decision making (see chart for  details).  ----------------------------------------- 3:43 PM on 04/26/2017 -----------------------------------------  Patient was able to tolerate by mouth contrast. Says was able to pass gas but not move his bowels. Mild to moderate tenderness that is persistent to the upper abdomen. I discussed the case with Dr. Aleen Campi of the surgical service who reviewed the patient's imaging and who recommends admission for observation. I discussed this plan with the patient and he says that he is unable to stay due to family responsibilities. He says that he is watches grandchildren tonight or else his son puts his job in jeopardy. We discussed the diagnosis and imaging and the risks of being discharged with a possible small bowel obstruction. We discussed the possible progression to more serious bowel obstruction resulting in surgery, disability or possible death. The patient is  not clinically intoxicated and understands his diagnosis and the risks of going as medical advice. He says that he will eat a very bland diet and return for any worsening or concerning symptoms. He understands that he is welcome to come back and that I encouraged him to come back at any point for further observation and admission. He is understanding of this plan and willing to comply. We also discussed the findings of the ultrasound. The patient will be given follow-up with urology.      ____________________________________________   FINAL CLINICAL IMPRESSION(S) / ED DIAGNOSES  Final diagnoses:  Scrotal pain  Varicocele. Hydrocele. Partial small bowel obstruction.    NEW MEDICATIONS STARTED DURING THIS VISIT:  New Prescriptions   No medications on file     Note:  This document was prepared using Dragon voice recognition software and may include unintentional dictation errors.     Myrna Blazer, MD 04/26/17 [phone]

## 2017-04-26 NOTE — ED Notes (Signed)
Pt returned from CT, resting in bed 

## 2017-04-26 NOTE — Consult Note (Signed)
Date of Consultation:  04/26/2017  Requesting Physician:  Dr. Gladstone Pih  Reason for Consultation:  Small bowel obstruction  History of Present Illness: George Colon is a 51 y.o. male who presents with a 2 day history of abdominal pain.  He had been recently treated for epididymitis.  He reports that his pain is in the mid abdomen, without radiation.  Describes one episode of vomiting this morning when he was trying to drink tea and a medication for indigestion.  He initially thought he had indigestion or reflux, but antiacid did not help, and he vomited after the indigestion medication.  He denies having any fevers, chills, chest pain, shortness of breath.  Reports having flatus and bowel movement.  Past Medical History: Past Medical History:  Diagnosis Date  . Anxiety   . Depression   . Diabetes mellitus without complication (HCC)   . Hypertension      Past Surgical History: --No surgeries.  Home Medications: Prior to Admission medications   Medication Sig Start Date End Date Taking? Authorizing Provider  amLODipine (NORVASC) 10 MG tablet Take 1 tablet (10 mg total) by mouth daily. 01/28/17  Yes Galen Manila, NP  carvedilol (COREG) 3.125 MG tablet Take 1 tablet (3.125 mg total) by mouth 2 (two) times daily with a meal. 01/28/17  Yes Galen Manila, NP  lisinopril-hydrochlorothiazide (ZESTORETIC) 20-12.5 MG tablet Take 1 tablet by mouth daily. 03/16/17  Yes Galen Manila, NP  metFORMIN (GLUCOPHAGE) 500 MG tablet Take 1 tablet (500 mg total) by mouth 2 (two) times daily with a meal. Patient taking differently: Take 500 mg by mouth daily with breakfast.  03/16/17  Yes Galen Manila, NP  naproxen sodium (ANAPROX) 220 MG tablet Take 440 mg by mouth 2 (two) times daily with a meal.   Yes [provider]  naproxen (NAPROSYN) 500 MG tablet Take 1 tablet (500 mg total) by mouth 2 (two) times daily with a meal. Patient not taking: Reported on  04/26/2017 03/16/17   Galen Manila, NP  sildenafil (REVATIO) 20 MG tablet Take 1-5 tablets as needed before sex. Patient not taking: Reported on 03/16/2017 10/12/16   Janeann Forehand., MD    Allergies: No Known Allergies  Social History:  reports that he has never smoked. He has never used smokeless tobacco. He reports that he does not drink alcohol or use drugs.   Family History: Family History  Problem Relation Age of Onset  . Heart disease Mother     Review of Systems: Review of Systems  Constitutional: Negative for chills and fever.  HENT: Negative for hearing loss.   Eyes: Negative for blurred vision.  Respiratory: Negative for shortness of breath.   Cardiovascular: Negative for chest pain.  Gastrointestinal: Positive for abdominal pain and vomiting. Negative for constipation, diarrhea and nausea.  Genitourinary: Negative for dysuria.  Musculoskeletal: Negative for myalgias.  Skin: Negative for rash.  Neurological: Negative for dizziness.  Psychiatric/Behavioral: Negative for depression.  All other systems reviewed and are negative.   Physical Exam BP 118/82   Pulse 73   Temp 98.9 F (37.2 C) (Oral)   Resp 12   Ht 6' (1.829 m)   Wt 131.5 kg (290 lb)   SpO2 96%   BMI 39.33 kg/m  CONSTITUTIONAL: No acute distress HEENT:  Normocephalic, atraumatic, extraocular motion intact. NECK: Trachea is midline, and there is no jugular venous distension.  RESPIRATORY:  Lungs are clear, and breath sounds are equal bilaterally. Normal respiratory  effort without pathologic use of accessory muscles. CARDIOVASCULAR: Heart is regular without murmurs, gallops, or rubs. GI: The abdomen is soft, obese, nondistended, with mild discomfort in the mid-abdomen.  No peritoneal signs.  MUSCULOSKELETAL:  Normal muscle strength and tone in all four extremities.  No peripheral edema or cyanosis. SKIN: Skin turgor is normal. There are no pathologic skin lesions.  NEUROLOGIC:  Motor and  sensation is grossly normal.  Cranial nerves are grossly intact. PSYCH:  Alert and oriented to person, place and time. Affect is normal.  Laboratory Analysis: Results for orders placed or performed during the hospital encounter of 04/26/17 (from the past 24 hour(s))  Lipase, blood     Status: None   Collection Time: 04/26/17 10:23 AM  Result Value Ref Range   Lipase 34 11 - 51 U/L  Comprehensive metabolic panel     Status: Abnormal   Collection Time: 04/26/17 10:23 AM  Result Value Ref Range   Sodium 134 (L) 135 - 145 mmol/L   Potassium 4.1 3.5 - 5.1 mmol/L   Chloride 101 101 - 111 mmol/L   CO2 23 22 - 32 mmol/L   Glucose, Bld 188 (H) 65 - 99 mg/dL   BUN 13 6 - 20 mg/dL   Creatinine, Ser 1.610.86 0.61 - 1.24 mg/dL   Calcium 9.0 8.9 - 09.610.3 mg/dL   Total Protein 7.5 6.5 - 8.1 g/dL   Albumin 4.4 3.5 - 5.0 g/dL   AST 29 15 - 41 U/L   ALT 18 17 - 63 U/L   Alkaline Phosphatase 56 38 - 126 U/L   Total Bilirubin 1.1 0.3 - 1.2 mg/dL   GFR calc non Af Amer >60 >60 mL/min   GFR calc Af Amer >60 >60 mL/min   Anion gap 10 5 - 15  CBC     Status: Abnormal   Collection Time: 04/26/17 10:23 AM  Result Value Ref Range   WBC 12.3 (H) 3.8 - 10.6 K/uL   RBC 5.39 4.40 - 5.90 MIL/uL   Hemoglobin 16.3 13.0 - 18.0 g/dL   HCT 04.548.0 40.940.0 - 81.152.0 %   MCV 89.0 80.0 - 100.0 fL   MCH 30.3 26.0 - 34.0 pg   MCHC 34.0 32.0 - 36.0 g/dL   RDW 91.414.3 78.211.5 - 95.614.5 %   Platelets 178 150 - 440 K/uL  Urinalysis, Complete w Microscopic     Status: Abnormal   Collection Time: 04/26/17 10:23 AM  Result Value Ref Range   Color, Urine AMBER (A) YELLOW   APPearance CLEAR (A) CLEAR   Specific Gravity, Urine 1.033 (H) 1.005 - 1.030   pH 5.0 5.0 - 8.0   Glucose, UA NEGATIVE NEGATIVE mg/dL   Hgb urine dipstick NEGATIVE NEGATIVE   Bilirubin Urine NEGATIVE NEGATIVE   Ketones, ur 5 (A) NEGATIVE mg/dL   Protein, ur 30 (A) NEGATIVE mg/dL   Nitrite NEGATIVE NEGATIVE   Leukocytes, UA NEGATIVE NEGATIVE   RBC / HPF NONE SEEN 0 -  5 RBC/hpf   WBC, UA 0-5 0 - 5 WBC/hpf   Bacteria, UA NONE SEEN NONE SEEN   Squamous Epithelial / LPF NONE SEEN NONE SEEN   Mucous PRESENT   Troponin I     Status: None   Collection Time: 04/26/17 11:53 AM  Result Value Ref Range   Troponin I <0.03 <0.03 ng/mL    Imaging: [place] Scrotum  Result Date: 04/26/2017 CLINICAL DATA:  51 year old male with acute right scrotal pain for 3 weeks. EXAM: SCROTAL ULTRASOUND DOPPLER  ULTRASOUND OF THE TESTICLES TECHNIQUE: Complete ultrasound examination of the testicles, epididymis, and other scrotal structures was performed. Color and spectral Doppler ultrasound were also utilized to evaluate blood flow to the testicles. COMPARISON:  None. FINDINGS: Right testicle Measurements: 4.6 x 2.4 x 3.5 cm. No mass or microlithiasis visualized. Left testicle Measurements: 3.7 x 2.1 x 3 cm. No mass or microlithiasis visualized. Right epididymis: Normal in size and appearance except for small epididymal cysts. Left epididymis: Normal in size and appearance except for small epididymal cysts. Hydrocele:  Small right hydrocele Varicocele:  Bilateral varicoceles Pulsed Doppler interrogation of both testes demonstrates normal low resistance arterial and venous waveforms bilaterally. IMPRESSION: Normal testicles.  No evidence of testicular mass or torsion. No significant epididymal abnormalities. Small right hydrocele and bilateral varicoceles. Electronically Signed   By: Harmon Pier M.D.   On: 04/26/2017 13:42   Ct Abdomen Pelvis W Contrast  Result Date: 04/26/2017 CLINICAL DATA:  Acute generalized abdominal pain. EXAM: CT ABDOMEN AND PELVIS WITH CONTRAST TECHNIQUE: Multidetector CT imaging of the abdomen and pelvis was performed using the standard protocol following bolus administration of intravenous contrast. CONTRAST:  ISOVUE-300 IOPAMIDOL (ISOVUE-300) INJECTION 61% COMPARISON:  None. FINDINGS: Lower chest: Minimal bilateral posterior basilar subsegmental atelectasis.  Hepatobiliary: Small solitary gallstone is noted. The liver appears normal. Pancreas: Unremarkable. No pancreatic ductal dilatation or surrounding inflammatory changes. Spleen: Normal in size without focal abnormality. Adrenals/Urinary Tract: Adrenal glands are unremarkable. Kidneys are normal, without renal calculi, focal lesion, or hydronephrosis. Bladder is unremarkable. Stomach/Bowel: The stomach appears normal. The appendix appears normal. Moderate small bowel dilatation is noted without definite transition zone. No inflammation is noted. Vascular/Lymphatic: Aortic atherosclerosis. No enlarged abdominal or pelvic lymph nodes. Reproductive: Prostate is unremarkable. Other: No abdominal wall hernia or abnormality. No abdominopelvic ascites. Musculoskeletal: No acute or significant osseous findings. IMPRESSION: Small solitary gallstone. Aortic atherosclerosis. Moderate small bowel dilatation is noted concerning for partial small bowel obstruction or possibly ileus. Follow-up radiographs are recommended. Electronically Signed   By: Lupita Raider, M.D.   On: 04/26/2017 14:42   [name] Art/ven Flow Abd Pelv Doppler  Result Date: 04/26/2017 CLINICAL DATA:  51 year old male with acute right scrotal pain for 3 weeks. EXAM: SCROTAL ULTRASOUND DOPPLER ULTRASOUND OF THE TESTICLES TECHNIQUE: Complete ultrasound examination of the testicles, epididymis, and other scrotal structures was performed. Color and spectral Doppler ultrasound were also utilized to evaluate blood flow to the testicles. COMPARISON:  None. FINDINGS: Right testicle Measurements: 4.6 x 2.4 x 3.5 cm. No mass or microlithiasis visualized. Left testicle Measurements: 3.7 x 2.1 x 3 cm. No mass or microlithiasis visualized. Right epididymis: Normal in size and appearance except for small epididymal cysts. Left epididymis: Normal in size and appearance except for small epididymal cysts. Hydrocele:  Small right hydrocele Varicocele:  Bilateral varicoceles  Pulsed Doppler interrogation of both testes demonstrates normal low resistance arterial and venous waveforms bilaterally. IMPRESSION: Normal testicles.  No evidence of testicular mass or torsion. No significant epididymal abnormalities. Small right hydrocele and bilateral varicoceles. Electronically Signed   By: Harmon Pier M.D.   On: 04/26/2017 13:42    Assessment and Plan: This is a 51 y.o. male who presents with abdominal pain and CT scan showing partial small bowel obstruction.  I have independently viewed the patient's CT scan as well as laboratory studies.  His CT scan does show some distended small bowel loops, without a high grade transition point.  WBC is mildly elevated only.  Discussed with the patient  that as he is feeling better and he is having flatus and bowel movement, likely he has a partial small bowel obstruction.  The plan would be for admission for observation, with slow advancement of diet as tolerated, likely without an NG tube.  However, the patient reports that his social situation prevents him from staying overnight.  He reports that he is the one that takes care of grandchildren at night and that his son and son's wife have to work each night and if they miss work that they could be fired.  The patient wishes to go home instead.  Given that his symptoms are not as severe, discussed with patient that this would be reasonable.  However, he should be on clear liquids today and slowly advance tomorrow if he's feeling better.  If however, there's persisting pain, worsening pain, nausea, vomiting, or other symptoms, that he should come back right away to be re-evaluated.  The patient understands this and all of his questions have been answered.   Howie IllJose Luis Deandrew Hoecker, MD [facility]

## 2017-04-28 ENCOUNTER — Encounter: Payer: Self-pay | Admitting: Emergency Medicine

## 2017-04-28 ENCOUNTER — Emergency Department: Payer: Self-pay

## 2017-04-28 ENCOUNTER — Emergency Department
Admission: EM | Admit: 2017-04-28 | Discharge: 2017-04-28 | Disposition: A | Discharge status: Home / Self Care | Payer: Self-pay | Attending: Emergency Medicine | Admitting: Emergency Medicine

## 2017-04-28 DIAGNOSIS — I10 Essential (primary) hypertension: Secondary | ICD-10-CM | POA: Insufficient documentation

## 2017-04-28 DIAGNOSIS — K529 Noninfective gastroenteritis and colitis, unspecified: Secondary | ICD-10-CM | POA: Insufficient documentation

## 2017-04-28 DIAGNOSIS — Z7984 Long term (current) use of oral hypoglycemic drugs: Secondary | ICD-10-CM | POA: Insufficient documentation

## 2017-04-28 DIAGNOSIS — K5669 Other partial intestinal obstruction: Secondary | ICD-10-CM | POA: Insufficient documentation

## 2017-04-28 DIAGNOSIS — E119 Type 2 diabetes mellitus without complications: Secondary | ICD-10-CM | POA: Insufficient documentation

## 2017-04-28 DIAGNOSIS — Z79899 Other long term (current) drug therapy: Secondary | ICD-10-CM | POA: Insufficient documentation

## 2017-04-28 LAB — BASIC METABOLIC PANEL
ANION GAP: 8 (ref 5–15)
BUN: 13 mg/dL (ref 6–20)
CALCIUM: 8.8 mg/dL — AB (ref 8.9–10.3)
CHLORIDE: 101 mmol/L (ref 101–111)
CO2: 25 mmol/L (ref 22–32)
Creatinine, Ser: 0.87 mg/dL (ref 0.61–1.24)
GFR calc Af Amer: >60 mL/min (ref >60)
GFR calc non Af Amer: >60 mL/min (ref >60)
GLUCOSE: 183 mg/dL — AB (ref 65–99)
Potassium: 3.6 mmol/L (ref 3.5–5.1)
Sodium: 134 mmol/L — ABNORMAL LOW (ref 135–145)

## 2017-04-28 LAB — CBC WITH DIFFERENTIAL/PLATELET
BASOS ABS: 0 10*3/uL (ref 0–0.1)
Basophils Relative: 0 %
Eosinophils Absolute: 0.1 10*3/uL (ref 0–0.7)
Eosinophils Relative: 1 %
HEMATOCRIT: 43.3 % (ref 40.0–52.0)
HEMOGLOBIN: 15.1 g/dL (ref 13.0–18.0)
LYMPHS PCT: 13 %
Lymphs Abs: 1.2 10*3/uL (ref 1.0–3.6)
MCH: 30.7 pg (ref 26.0–34.0)
MCHC: 34.9 g/dL (ref 32.0–36.0)
MCV: 88 fL (ref 80.0–100.0)
MONO ABS: 0.7 10*3/uL (ref 0.2–1.0)
MONOS PCT: 8 %
NEUTROS ABS: 7.2 10*3/uL — AB (ref 1.4–6.5)
Neutrophils Relative %: 78 %
Platelets: 155 10*3/uL (ref 150–440)
RBC: 4.92 MIL/uL (ref 4.40–5.90)
RDW: 14.3 % (ref 11.5–14.5)
WBC: 9.2 10*3/uL (ref 3.8–10.6)

## 2017-04-28 LAB — HEPATIC FUNCTION PANEL
ALK PHOS: 55 U/L (ref 38–126)
ALT: 26 U/L (ref 17–63)
AST: 30 U/L (ref 15–41)
Albumin: 4.1 g/dL (ref 3.5–5.0)
BILIRUBIN DIRECT: 0.2 mg/dL (ref 0.1–0.5)
Indirect Bilirubin: 0.8 mg/dL (ref 0.3–0.9)
Total Bilirubin: 1 mg/dL (ref 0.3–1.2)
Total Protein: 7.4 g/dL (ref 6.5–8.1)

## 2017-04-28 LAB — LACTIC ACID, PLASMA: Lactic Acid, Venous: 1.4 mmol/L (ref 0.5–1.9)

## 2017-04-28 LAB — LIPASE, BLOOD: LIPASE: 24 U/L (ref 11–51)

## 2017-04-28 MED ORDER — IOPAMIDOL (ISOVUE-300) INJECTION 61%
100.0000 mL | Freq: Once | INTRAVENOUS | Status: AC | PRN
  Administered 2017-04-28: 100 mL via INTRAVENOUS

## 2017-04-28 MED ORDER — CIPROFLOXACIN HCL 500 MG PO TABS: 500.0000 mg | ORAL_TABLET | Freq: Two times a day (BID) | ORAL | 0 refills | Status: AC

## 2017-04-28 MED ORDER — ONDANSETRON HCL 4 MG/2ML IJ SOLN
4.0000 mg | Freq: Once | INTRAMUSCULAR | Status: AC
  Administered 2017-04-28: 4 mg via INTRAVENOUS
  Filled 2017-04-28: qty 2

## 2017-04-28 MED ORDER — SODIUM CHLORIDE 0.9 % IV BOLUS (SEPSIS)
1000.0000 mL | Freq: Once | INTRAVENOUS | Status: AC
  Administered 2017-04-28: 1000 mL via INTRAVENOUS

## 2017-04-28 NOTE — ED Triage Notes (Signed)
pt to ed with c/o abd pain and cramping that started last night after eating.  Pt states that he was seen here last week for same and left AMA after being told he had a partial blockage in his intestines.  Pt reports vomiting x 1 last night.

## 2017-04-28 NOTE — Consult Note (Signed)
Date of Consultation:  04/28/2017  Requesting Physician:  Merrily BrittleNeil Rifenbark, MD  Reason for Consultation:  Small bowel obstruction  History of Present Illness: George Colon is a 51 y.o. male seen recently in the ED on 7/30 with partial small bowel obstruction.  At that point, the patient was not having any nausea and had been having bowel movements.  He was discharged to home as the patient could not stay due to social issues at home.  Was advised to stay with a clear liquid diet and advance slowly.  He was tolerating clears well, and then tried chicken broth with chicken meat and rice last night and then started having some mid abdominal pain and bloatedness.  He has not had any nausea or vomiting.  Overnight, he had a large bowel movement and in the ED, has had two bouts of diarrhea.  Has continued to have flatus.    In the ED, workup including labs and CT scan were repeated.  WBC is now normal at 9.2 and his CT scan was concerning again for small bowel obstruction per radiology report.    Of note, he does report that at work one of his colleagues has been dealing with similar symptoms.  Past Medical History: Past Medical History:  Diagnosis Date  . Anxiety   . Depression   . Diabetes mellitus without complication (HCC)   . Hypertension      Past Surgical History: --No abdominal surgeries.  Home Medications: Prior to Admission medications   Medication Sig Start Date End Date Taking? Authorizing Provider  amLODipine (NORVASC) 10 MG tablet Take 1 tablet (10 mg total) by mouth daily. 01/28/17  Yes Galen ManilaKennedy, Lauren Renee, NP  carvedilol (COREG) 3.125 MG tablet Take 1 tablet (3.125 mg total) by mouth 2 (two) times daily with a meal. 01/28/17  Yes Galen ManilaKennedy, Lauren Renee, NP  lisinopril-hydrochlorothiazide (ZESTORETIC) 20-12.5 MG tablet Take 1 tablet by mouth daily. 03/16/17  Yes Galen ManilaKennedy, Lauren Renee, NP  metFORMIN (GLUCOPHAGE) 500 MG tablet Take 1 tablet (500 mg total) by mouth 2 (two) times  daily with a meal. Patient taking differently: Take 500 mg by mouth daily with breakfast.  03/16/17  Yes Galen ManilaKennedy, Lauren Renee, NP  naproxen sodium (ANAPROX) 220 MG tablet Take 440 mg by mouth 2 (two) times daily with a meal.   Yes [provider]  ciprofloxacin (CIPRO) 500 MG tablet Take 1 tablet (500 mg total) by mouth 2 (two) times daily. 04/28/17 05/01/17  Merrily Brittleifenbark, Neil, MD  naproxen (NAPROSYN) 500 MG tablet Take 1 tablet (500 mg total) by mouth 2 (two) times daily with a meal. Patient not taking: Reported on 04/26/2017 03/16/17   Galen ManilaKennedy, Lauren Renee, NP  sildenafil (REVATIO) 20 MG tablet Take 1-5 tablets as needed before sex. Patient not taking: Reported on 03/16/2017 10/12/16   Janeann ForehandHawkins, James H Jr., MD    Allergies: No Known Allergies  Social History:  reports that he has never smoked. He has never used smokeless tobacco. He reports that he does not drink alcohol or use drugs.   Family History: Family History  Problem Relation Age of Onset  . Heart disease Mother     Review of Systems: Review of Systems  Constitutional: Negative for chills and fever.  HENT: Negative for hearing loss.   Eyes: Negative for blurred vision.  Respiratory: Negative for shortness of breath.   Cardiovascular: Negative for chest pain.  Gastrointestinal: Positive for abdominal pain and diarrhea. Negative for constipation, nausea and vomiting.  Genitourinary: Negative  for dysuria.  Musculoskeletal: Negative for myalgias.  Skin: Negative for rash.  Neurological: Negative for dizziness.  Psychiatric/Behavioral: Negative for depression.  All other systems reviewed and are negative.   Physical Exam BP 107/73 (BP Location: Right Arm)   Pulse 66   Temp (!) 97.2 F (36.2 C) (Oral)   Resp 20   Wt 131.5 kg (290 lb)   SpO2 100%   BMI 39.33 kg/m  CONSTITUTIONAL: No acute distress HEENT:  Normocephalic, atraumatic, extraocular motion intact. NECK: Trachea is midline, and there is no jugular  venous distension.  RESPIRATORY:  Lungs are clear, and breath sounds are equal bilaterally. Normal respiratory effort without pathologic use of accessory muscles. CARDIOVASCULAR: Heart is regular without murmurs, gallops, or rubs. GI: The abdomen is soft, obese, nondistended, with only minimal discomfort on palpation over the mid abdomen.  MUSCULOSKELETAL:  Normal muscle strength and tone in all four extremities.  No peripheral edema or cyanosis. SKIN: Skin turgor is normal. There are no pathologic skin lesions.  NEUROLOGIC:  Motor and sensation is grossly normal.  Cranial nerves are grossly intact. PSYCH:  Alert and oriented to person, place and time. Affect is normal.  Laboratory Analysis: Results for orders placed or performed during the hospital encounter of 04/28/17 (from the past 24 hour(s))  CBC with Differential     Status: Abnormal   Collection Time: 04/28/17  8:53 AM  Result Value Ref Range   WBC 9.2 3.8 - 10.6 K/uL   RBC 4.92 4.40 - 5.90 MIL/uL   Hemoglobin 15.1 13.0 - 18.0 g/dL   HCT 40.943.3 81.140.0 - 91.452.0 %   MCV 88.0 80.0 - 100.0 fL   MCH 30.7 26.0 - 34.0 pg   MCHC 34.9 32.0 - 36.0 g/dL   RDW 78.214.3 95.611.5 - 21.314.5 %   Platelets 155 150 - 440 K/uL   Neutrophils Relative % 78 %   Neutro Abs 7.2 (H) 1.4 - 6.5 K/uL   Lymphocytes Relative 13 %   Lymphs Abs 1.2 1.0 - 3.6 K/uL   Monocytes Relative 8 %   Monocytes Absolute 0.7 0.2 - 1.0 K/uL   Eosinophils Relative 1 %   Eosinophils Absolute 0.1 0 - 0.7 K/uL   Basophils Relative 0 %   Basophils Absolute 0.0 0 - 0.1 K/uL  Basic metabolic panel     Status: Abnormal   Collection Time: 04/28/17  8:53 AM  Result Value Ref Range   Sodium 134 (L) 135 - 145 mmol/L   Potassium 3.6 3.5 - 5.1 mmol/L   Chloride 101 101 - 111 mmol/L   CO2 25 22 - 32 mmol/L   Glucose, Bld 183 (H) 65 - 99 mg/dL   BUN 13 6 - 20 mg/dL   Creatinine, Ser 0.860.87 0.61 - 1.24 mg/dL   Calcium 8.8 (L) 8.9 - 10.3 mg/dL   GFR calc non Af Amer >60 >60 mL/min   GFR calc Af  Amer >60 >60 mL/min   Anion gap 8 5 - 15  Hepatic function panel     Status: None   Collection Time: 04/28/17  8:53 AM  Result Value Ref Range   Total Protein 7.4 6.5 - 8.1 g/dL   Albumin 4.1 3.5 - 5.0 g/dL   AST 30 15 - 41 U/L   ALT 26 17 - 63 U/L   Alkaline Phosphatase 55 38 - 126 U/L   Total Bilirubin 1.0 0.3 - 1.2 mg/dL   Bilirubin, Direct 0.2 0.1 - 0.5 mg/dL   Indirect Bilirubin  0.8 0.3 - 0.9 mg/dL  Lactic acid, plasma     Status: None   Collection Time: 04/28/17  8:53 AM  Result Value Ref Range   Lactic Acid, Venous 1.4 0.5 - 1.9 mmol/L  Lipase, blood     Status: None   Collection Time: 04/28/17  8:53 AM  Result Value Ref Range   Lipase 24 11 - 51 U/L    Imaging: Ct Abdomen Pelvis W Contrast  Result Date: 04/28/2017 CLINICAL DATA:  Abdominal pain and cramping EXAM: CT ABDOMEN AND PELVIS WITH CONTRAST TECHNIQUE: Multidetector CT imaging of the abdomen and pelvis was performed using the standard protocol following bolus administration of intravenous contrast. CONTRAST:  ISOVUE-300 IOPAMIDOL (ISOVUE-300) INJECTION 61% COMPARISON:  April 26, 2017 FINDINGS: Lower chest: There is slight bibasilar atelectatic change. Lung bases otherwise are clear. There are foci of coronary artery calcification. Hepatobiliary: No focal liver lesions are evident. There is cholelithiasis. Gallbladder wall does not appear appreciably thickened. There is no biliary duct dilatation. Pancreas: No pancreatic mass or inflammatory focus. Spleen: No splenic lesions are evident. There is a small accessory spleen inferior to the spleen. Adrenals/Urinary Tract: Adrenals appear normal bilaterally. There is a 7 mm cyst arising from the posterior aspect of the upper pole of the right kidney. There is a 7 mm cyst in the posterior left kidney upper pole region as well as a second 6 mm cyst nearby in the upper pole left kidney. There is a 7 mm cyst in the lower pole of the right kidney. No hydronephrosis is evident on  either side. There is no renal or ureteral calculus on either side. Urinary bladder is midline with mild wall thickening. Stomach/Bowel: There is small bowel dilatation to the level of the mid ileum. A transition zone is noted in this area consistent with a degree of small bowel obstruction. There is fluid throughout much of the bowel. There is no free air or portal venous air. Mild mesenteric edema is noted in the anterior mid abdomen, a finding which is felt to be due to the bowel obstruction. Vascular/Lymphatic: There is atherosclerotic calcification in the aorta and common iliac arteries. There is also calcification in each hypogastric artery. No aneurysm. There is mild calcification in the major mesenteric vessels without major vessel obstruction. There is no appreciable adenopathy in the abdomen or pelvis. Reproductive: Prostate and seminal vesicles appear normal in size and contour. There are a few small inferior prostatic calculi. Other: There is no periappendiceal region inflammation. Appendix appears normal. There is no evident abscess or ascites in the abdomen or pelvis. There is a small ventral hernia containing fat but no bowel. There is mild periumbilical scarring. Musculoskeletal: There is degenerative change in the lower thoracic and lumbar spine regions. There are no blastic or lytic bone lesions. There is no intramuscular lesion. IMPRESSION: 1. Evidence of small bowel obstruction with transition zone in the mid to distal ileal region. No free air. Mild anterior mesenteric edema is likely due to the bowel obstruction. 2. There is a degree of urinary bladder wall thickening. Suspect cystitis. No hydronephrosis. No renal or ureteral calculus. 3.  No abscess.  Appendix appears normal. 4.  Cholelithiasis.  Gallbladder wall not thickened. 5. There is aortoiliac atherosclerosis. There is coronary artery calcification as well as mild calcification in several mesenteric vessels. 6.  Small ventral hernia  containing only fat. Aortic Atherosclerosis (ICD10-I70.0). Electronically Signed   By: Bretta Bang III M.D.   On: 04/28/2017 09:41  Assessment and Plan: This is a 51 y.o. male who presents with abdominal bloatedness and discomfort and diarrhea.  I have independently viewed the patient's imaging studies and reviewed his laboratory studies.  Overall, his WBC is normal and labs are normal today.  His CT scan, though read as a small bowel obstruction, to me looks like gastroenteritis given his symptoms as well.  He has had bowel movements and now diarrhea and has not had any nausea or vomiting.  Though in theory this could still be a partial SBO, given his symptoms, he is presenting more as a gastroenteritis picture, particularly with sick contacts at work.  The patient again has declined admission due to social issues with his son and work.  Reiterated with the patient that I have a low suspicion for SBO, but nevertheless, I could not be 100% sure of this.  He understands this.  I have recommended that he maintain a clear liquid diet and went with him over list of what is considered clear liquids.  He may advance slowly to full liquid diet tomorrow or 8/3 depending on how he is doing.  He reports that his son does not have to work on 8/3 at night, and if he has recurrent symptoms, then he will come back and would be agreeable for admission if need be.   Howie Ill, MD [facility]

## 2017-04-28 NOTE — ED Notes (Signed)
Pt resting in bed, denies any needs, aware of pending admission

## 2017-04-28 NOTE — Discharge Instructions (Signed)
Today you were evaluated by a general surgeon who feels that you do not have a small bowel obstruction but rather you have an infection which is causing her diarrhea. Please take all of your antibiotics as prescribed and return to the emergency department immediately for any new or worsening symptoms such as fevers, chills, worsening pain, or any other concerns whatsoever.  It was a pleasure to take care of you today, and thank you for coming to our emergency department.  If you have any questions or concerns before leaving please ask the nurse to grab me and I'm more than happy to go through your aftercare instructions again.  If you were prescribed any opioid pain medication today such as Norco, Vicodin, Percocet, morphine, hydrocodone, or oxycodone please make sure you do not drive when you are taking this medication as it can alter your ability to drive safely.  If you have any concerns once you are home that you are not improving or are in fact getting worse before you can make it to your follow-up appointment, please do not hesitate to call 911 and come back for further evaluation.  Merrily BrittleNeil Beverly Suriano, MD  Results for orders placed or performed during the hospital encounter of 04/28/17  CBC with Differential  Result Value Ref Range   WBC 9.2 3.8 - 10.6 K/uL   RBC 4.92 4.40 - 5.90 MIL/uL   Hemoglobin 15.1 13.0 - 18.0 g/dL   HCT 16.143.3 09.640.0 - 04.552.0 %   MCV 88.0 80.0 - 100.0 fL   MCH 30.7 26.0 - 34.0 pg   MCHC 34.9 32.0 - 36.0 g/dL   RDW 40.914.3 81.111.5 - 91.414.5 %   Platelets 155 150 - 440 K/uL   Neutrophils Relative % 78 %   Neutro Abs 7.2 (H) 1.4 - 6.5 K/uL   Lymphocytes Relative 13 %   Lymphs Abs 1.2 1.0 - 3.6 K/uL   Monocytes Relative 8 %   Monocytes Absolute 0.7 0.2 - 1.0 K/uL   Eosinophils Relative 1 %   Eosinophils Absolute 0.1 0 - 0.7 K/uL   Basophils Relative 0 %   Basophils Absolute 0.0 0 - 0.1 K/uL  Basic metabolic panel  Result Value Ref Range   Sodium 134 (L) 135 - 145 mmol/L   Potassium 3.6 3.5 - 5.1 mmol/L   Chloride 101 101 - 111 mmol/L   CO2 25 22 - 32 mmol/L   Glucose, Bld 183 (H) 65 - 99 mg/dL   BUN 13 6 - 20 mg/dL   Creatinine, Ser 7.820.87 0.61 - 1.24 mg/dL   Calcium 8.8 (L) 8.9 - 10.3 mg/dL   GFR calc non Af Amer >60 >60 mL/min   GFR calc Af Amer >60 >60 mL/min   Anion gap 8 5 - 15  Hepatic function panel  Result Value Ref Range   Total Protein 7.4 6.5 - 8.1 g/dL   Albumin 4.1 3.5 - 5.0 g/dL   AST 30 15 - 41 U/L   ALT 26 17 - 63 U/L   Alkaline Phosphatase 55 38 - 126 U/L   Total Bilirubin 1.0 0.3 - 1.2 mg/dL   Bilirubin, Direct 0.2 0.1 - 0.5 mg/dL   Indirect Bilirubin 0.8 0.3 - 0.9 mg/dL  Lactic acid, plasma  Result Value Ref Range   Lactic Acid, Venous 1.4 0.5 - 1.9 mmol/L  Lipase, blood  Result Value Ref Range   Lipase 24 11 - 51 U/L   [place] Scrotum  Result Date: 04/26/2017 CLINICAL DATA:  51 year old  male with acute right scrotal pain for 3 weeks. EXAM: SCROTAL ULTRASOUND DOPPLER ULTRASOUND OF THE TESTICLES TECHNIQUE: Complete ultrasound examination of the testicles, epididymis, and other scrotal structures was performed. Color and spectral Doppler ultrasound were also utilized to evaluate blood flow to the testicles. COMPARISON:  None. FINDINGS: Right testicle Measurements: 4.6 x 2.4 x 3.5 cm. No mass or microlithiasis visualized. Left testicle Measurements: 3.7 x 2.1 x 3 cm. No mass or microlithiasis visualized. Right epididymis: Normal in size and appearance except for small epididymal cysts. Left epididymis: Normal in size and appearance except for small epididymal cysts. Hydrocele:  Small right hydrocele Varicocele:  Bilateral varicoceles Pulsed Doppler interrogation of both testes demonstrates normal low resistance arterial and venous waveforms bilaterally. IMPRESSION: Normal testicles.  No evidence of testicular mass or torsion. No significant epididymal abnormalities. Small right hydrocele and bilateral varicoceles. Electronically Signed   By:  Harmon PierJeffrey  Hu M.D.   On: 04/26/2017 13:42   Ct Abdomen Pelvis W Contrast  Result Date: 04/28/2017 CLINICAL DATA:  Abdominal pain and cramping EXAM: CT ABDOMEN AND PELVIS WITH CONTRAST TECHNIQUE: Multidetector CT imaging of the abdomen and pelvis was performed using the standard protocol following bolus administration of intravenous contrast. CONTRAST:  100mL ISOVUE-300 IOPAMIDOL (ISOVUE-300) INJECTION 61% COMPARISON:  April 26, 2017 FINDINGS: Lower chest: There is slight bibasilar atelectatic change. Lung bases otherwise are clear. There are foci of coronary artery calcification. Hepatobiliary: No focal liver lesions are evident. There is cholelithiasis. Gallbladder wall does not appear appreciably thickened. There is no biliary duct dilatation. Pancreas: No pancreatic mass or inflammatory focus. Spleen: No splenic lesions are evident. There is a small accessory spleen inferior to the spleen. Adrenals/Urinary Tract: Adrenals appear normal bilaterally. There is a 7 mm cyst arising from the posterior aspect of the upper pole of the right kidney. There is a 7 mm cyst in the posterior left kidney upper pole region as well as a second 6 mm cyst nearby in the upper pole left kidney. There is a 7 mm cyst in the lower pole of the right kidney. No hydronephrosis is evident on either side. There is no renal or ureteral calculus on either side. Urinary bladder is midline with mild wall thickening. Stomach/Bowel: There is small bowel dilatation to the level of the mid ileum. A transition zone is noted in this area consistent with a degree of small bowel obstruction. There is fluid throughout much of the bowel. There is no free air or portal venous air. Mild mesenteric edema is noted in the anterior mid abdomen, a finding which is felt to be due to the bowel obstruction. Vascular/Lymphatic: There is atherosclerotic calcification in the aorta and common iliac arteries. There is also calcification in each hypogastric artery. No  aneurysm. There is mild calcification in the major mesenteric vessels without major vessel obstruction. There is no appreciable adenopathy in the abdomen or pelvis. Reproductive: Prostate and seminal vesicles appear normal in size and contour. There are a few small inferior prostatic calculi. Other: There is no periappendiceal region inflammation. Appendix appears normal. There is no evident abscess or ascites in the abdomen or pelvis. There is a small ventral hernia containing fat but no bowel. There is mild periumbilical scarring. Musculoskeletal: There is degenerative change in the lower thoracic and lumbar spine regions. There are no blastic or lytic bone lesions. There is no intramuscular lesion. IMPRESSION: 1. Evidence of small bowel obstruction with transition zone in the mid to distal ileal region. No free air. Mild anterior  mesenteric edema is likely due to the bowel obstruction. 2. There is a degree of urinary bladder wall thickening. Suspect cystitis. No hydronephrosis. No renal or ureteral calculus. 3.  No abscess.  Appendix appears normal. 4.  Cholelithiasis.  Gallbladder wall not thickened. 5. There is aortoiliac atherosclerosis. There is coronary artery calcification as well as mild calcification in several mesenteric vessels. 6.  Small ventral hernia containing only fat. Aortic Atherosclerosis (ICD10-I70.0). Electronically Signed   By: Bretta Bang III M.D.   On: 04/28/2017 09:41   Ct Abdomen Pelvis W Contrast  Result Date: 04/26/2017 CLINICAL DATA:  Acute generalized abdominal pain. EXAM: CT ABDOMEN AND PELVIS WITH CONTRAST TECHNIQUE: Multidetector CT imaging of the abdomen and pelvis was performed using the standard protocol following bolus administration of intravenous contrast. CONTRAST:  ISOVUE-300 IOPAMIDOL (ISOVUE-300) INJECTION 61% COMPARISON:  None. FINDINGS: Lower chest: Minimal bilateral posterior basilar subsegmental atelectasis. Hepatobiliary: Small solitary gallstone is  noted. The liver appears normal. Pancreas: Unremarkable. No pancreatic ductal dilatation or surrounding inflammatory changes. Spleen: Normal in size without focal abnormality. Adrenals/Urinary Tract: Adrenal glands are unremarkable. Kidneys are normal, without renal calculi, focal lesion, or hydronephrosis. Bladder is unremarkable. Stomach/Bowel: The stomach appears normal. The appendix appears normal. Moderate small bowel dilatation is noted without definite transition zone. No inflammation is noted. Vascular/Lymphatic: Aortic atherosclerosis. No enlarged abdominal or pelvic lymph nodes. Reproductive: Prostate is unremarkable. Other: No abdominal wall hernia or abnormality. No abdominopelvic ascites. Musculoskeletal: No acute or significant osseous findings. IMPRESSION: Small solitary gallstone. Aortic atherosclerosis. Moderate small bowel dilatation is noted concerning for partial small bowel obstruction or possibly ileus. Follow-up radiographs are recommended. Electronically Signed   By: Lupita Raider, M.D.   On: 04/26/2017 14:42   [name] Art/ven Flow Abd Pelv Doppler  Result Date: 04/26/2017 CLINICAL DATA:  51 year old male with acute right scrotal pain for 3 weeks. EXAM: SCROTAL ULTRASOUND DOPPLER ULTRASOUND OF THE TESTICLES TECHNIQUE: Complete ultrasound examination of the testicles, epididymis, and other scrotal structures was performed. Color and spectral Doppler ultrasound were also utilized to evaluate blood flow to the testicles. COMPARISON:  None. FINDINGS: Right testicle Measurements: 4.6 x 2.4 x 3.5 cm. No mass or microlithiasis visualized. Left testicle Measurements: 3.7 x 2.1 x 3 cm. No mass or microlithiasis visualized. Right epididymis: Normal in size and appearance except for small epididymal cysts. Left epididymis: Normal in size and appearance except for small epididymal cysts. Hydrocele:  Small right hydrocele Varicocele:  Bilateral varicoceles Pulsed Doppler interrogation of both testes  demonstrates normal low resistance arterial and venous waveforms bilaterally. IMPRESSION: Normal testicles.  No evidence of testicular mass or torsion. No significant epididymal abnormalities. Small right hydrocele and bilateral varicoceles. Electronically Signed   By: Harmon Pier M.D.   On: 04/26/2017 13:42

## 2017-04-28 NOTE — ED Provider Notes (Signed)
[facility] Emergency Department Provider Note  ____________________________________________   First MD Initiated Contact with Patient 04/28/17 0845     (approximate)  I have reviewed the triage vital signs and the nursing notes.   HISTORY  Chief Complaint Abdominal Pain   HPI George Colon is a 51 y.o. male who comes to the emergency Department with roughly 1 week of moderate severity abdominal cramping and nausea. He was seen in our emergency department 2 days ago where he had a CT scan showing a partial small bowel obstruction and surgery recommended inpatient admission however he declined and left the hospital AGAINST MEDICAL ADVICE. He said at home he has been drinking clear liquids and initially did well however today while at work he tried to eat a piece of fried chicken and shortly thereafter had a large bowel movement. Several of his coworkers of also had diarrhea. He has no history of abdominal surgery. He is concerned that his pain is recurred and that he has not had normal bowel movements and several days and he came to the emergency department for further evaluation. His symptoms seem to be progressive slow onset and nothing seems to make them better or worse.   Past Medical History:  Diagnosis Date  . Anxiety   . Depression   . Diabetes mellitus without complication (HCC)   . Hypertension     Patient Active Problem List   Diagnosis Date Noted  . Enteritis   . SBO (small bowel obstruction) (HCC)   . Diabetes (HCC) 07/02/2015  . HTN (hypertension) 07/02/2015  . Atrial fibrillation (HCC) 07/02/2015    History reviewed. No pertinent surgical history.  Prior to Admission medications   Medication Sig Start Date End Date Taking? Authorizing Provider  amLODipine (NORVASC) 10 MG tablet Take 1 tablet (10 mg total) by mouth daily. 01/28/17  Yes Galen Manila, NP  carvedilol (COREG) 3.125 MG tablet Take 1 tablet (3.125 mg total) by mouth  2 (two) times daily with a meal. 01/28/17  Yes Galen Manila, NP  lisinopril-hydrochlorothiazide (ZESTORETIC) 20-12.5 MG tablet Take 1 tablet by mouth daily. 03/16/17  Yes Galen Manila, NP  metFORMIN (GLUCOPHAGE) 500 MG tablet Take 1 tablet (500 mg total) by mouth 2 (two) times daily with a meal. Patient taking differently: Take 500 mg by mouth daily with breakfast.  03/16/17  Yes Galen Manila, NP  naproxen sodium (ANAPROX) 220 MG tablet Take 440 mg by mouth 2 (two) times daily with a meal.   Yes [provider]  ciprofloxacin (CIPRO) 500 MG tablet Take 1 tablet (500 mg total) by mouth 2 (two) times daily. 04/28/17 05/01/17  Merrily Brittle, MD  naproxen (NAPROSYN) 500 MG tablet Take 1 tablet (500 mg total) by mouth 2 (two) times daily with a meal. Patient not taking: Reported on 04/26/2017 03/16/17   Galen Manila, NP  sildenafil (REVATIO) 20 MG tablet Take 1-5 tablets as needed before sex. Patient not taking: Reported on 03/16/2017 10/12/16   Janeann Forehand., MD    Allergies Patient has no known allergies.  Family History  Problem Relation Age of Onset  . Heart disease Mother     Social History Social History  Substance Use Topics  . Smoking status: Never Smoker  . Smokeless tobacco: Never Used  . Alcohol use No    Review of Systems Constitutional: No fever/chills Eyes: No visual changes. ENT: No sore throat. Cardiovascular: Denies chest pain. Respiratory: Denies shortness of breath.  Gastrointestinal: Positive abdominal pain.  Positive nausea, no vomiting.  Positive diarrhea.  No constipation. Genitourinary: Negative for dysuria. Musculoskeletal: Negative for back pain. Skin: Negative for rash. Neurological: Negative for headaches, focal weakness or numbness.   ____________________________________________   PHYSICAL EXAM:  VITAL SIGNS: ED Triage Vitals [04/28/17 0838]  Enc Vitals Group     BP 118/82     Pulse Rate 80     Resp 18      Temp (!) 97.2 F (36.2 C)     Temp Source Oral     SpO2 98 %     Weight 290 lb (131.5 kg)     Height      Head Circumference      Peak Flow      Pain Score 5     Pain Loc      Pain Edu?      Excl. in GC?     Constitutional: Alert and oriented 4 well appearing nontoxic no diaphoresis speaks full clear sentences Eyes: PERRL EOMI. Head: Atraumatic. Nose: No congestion/rhinnorhea. Mouth/Throat: No trismus Neck: No stridor.   Cardiovascular: Normal rate, regular rhythm. Grossly normal heart sounds.  Good peripheral circulation. Respiratory: Normal respiratory effort.  No retractions. Lungs CTAB and moving good air Gastrointestinal: Soft abdomen mild diffuse tenderness without focality no rebound no guarding no peritonitis not distended Musculoskeletal: No lower extremity edema   Neurologic:  Normal speech and language. No gross focal neurologic deficits are appreciated. Skin:  Skin is warm, dry and intact. No rash noted. Psychiatric: Mood and affect are normal. Speech and behavior are normal.    ____________________________________________   DIFFERENTIAL includes but not limited to  Small bowel obstruction, large bowel obstruction, gastroenteritis, dysentery ____________________________________________   LABS (all labs ordered are listed, but only abnormal results are displayed)  Labs Reviewed  CBC WITH DIFFERENTIAL/PLATELET - Abnormal; Notable for the following:       Result Value   Neutro Abs 7.2 (*)    All other components within normal limits  BASIC METABOLIC PANEL - Abnormal; Notable for the following:    Sodium 134 (*)    Glucose, Bld 183 (*)    Calcium 8.8 (*)    All other components within normal limits  HEPATIC FUNCTION PANEL  LACTIC ACID, PLASMA  LIPASE, BLOOD    Normal white count and normal lactate __________________________________________  EKG   ____________________________________________  RADIOLOGY  CT scan concerning for small bowel  obstruction ____________________________________________   PROCEDURES  Procedure(s) performed: no  Procedures  Critical Care performed: no  Observation: no ____________________________________________   INITIAL IMPRESSION / ASSESSMENT AND PLAN / ED COURSE  Pertinent labs & imaging results that were available during my care of the patient were reviewed by me and considered in my medical decision making (see chart for details).  The patient arrives worsening abdominal pain and in the setting of her recent diagnosis of the small bowel obstruction. I'm concerned he has recurrence of labs including lactate will be obtained as well as a CT scan with IV contrast.      ____----------------------------------------- 11:52 AM on 04/28/2017 -----------------------------------------  I discussed the case with on-call general surgeon Dr. Aleen CampiPiscoya who examined the patient and feels that the patient does not have a true small bowel obstruction but rather does have gastroenteritis. He recommends symptomatic treatment and outpatient follow-up. ________________________________________   FINAL CLINICAL IMPRESSION(S) / ED DIAGNOSES  Final diagnoses:  Enteritis      NEW MEDICATIONS STARTED DURING THIS VISIT:  Discharge  Medication List as of 04/28/2017 12:34 PM    START taking these medications   Details  ciprofloxacin (CIPRO) 500 MG tablet Take 1 tablet (500 mg total) by mouth 2 (two) times daily., Starting Wed 04/28/2017, Until Sat 05/01/2017, Print         Note:  This document was prepared using Dragon voice recognition software and may include unintentional dictation errors.     Merrily Brittleifenbark, Ceil Roderick, MD 04/28/17 1642

## 2017-05-13 ENCOUNTER — Telehealth: Payer: Self-pay | Admitting: Nurse Practitioner

## 2017-05-13 NOTE — Telephone Encounter (Signed)
Pt [facility]  He asked if there was something on the $4 list that he could try instead.  His call back number is [phone]

## 2017-05-14 MED ORDER — DILTIAZEM HCL ER 120 MG PO CP24: 120.0000 mg | ORAL_CAPSULE | Freq: Every day | ORAL | 2 refills | Status: DC

## 2017-05-14 NOTE — Telephone Encounter (Signed)
Advised pt and he states that he won't start this new changed medicine now he still has old one left for 30 days and his plan to start new one after month and half, once again advice pt to make follow up appointment  in 2 weeks after starting new meds he understand very well.

## 2017-05-14 NOTE — Telephone Encounter (Signed)
We can change to a different medicine in the same drug class.  I have ordered diltiazem XR 120 mg once daily.  It is important to keep follow up appointments w/ this change because it could cause low heart rate more than amlodipine.  Please come for BP and HR check to clinic 2 weeks after starting this medicine.    Goal: keep HR above 60  Attempted to call pt to discuss. Phone Busy

## 2018-02-10 ENCOUNTER — Other Ambulatory Visit: Payer: Self-pay | Admitting: Family Medicine

## 2018-02-10 ENCOUNTER — Other Ambulatory Visit: Payer: Self-pay | Admitting: Nurse Practitioner

## 2018-02-10 ENCOUNTER — Telehealth: Payer: Self-pay | Admitting: Nurse Practitioner

## 2018-02-10 DIAGNOSIS — I10 Essential (primary) hypertension: Secondary | ICD-10-CM

## 2018-02-10 NOTE — Telephone Encounter (Signed)
Pt needs refill on carvedilol sent to [facility].  Please call [phone]

## 2018-02-10 NOTE — Telephone Encounter (Signed)
Send for approval. 

## 2018-03-04 ENCOUNTER — Encounter: Payer: Self-pay | Admitting: Nurse Practitioner

## 2018-03-04 ENCOUNTER — Ambulatory Visit: Payer: Self-pay | Admitting: Nurse Practitioner

## 2018-03-04 VITALS — BP 132/72 | HR 75 | Temp 98.1°F | Ht 72.0 in | Wt 289.2 lb

## 2018-03-04 DIAGNOSIS — R0989 Other specified symptoms and signs involving the circulatory and respiratory systems: Secondary | ICD-10-CM

## 2018-03-04 DIAGNOSIS — E1140 Type 2 diabetes mellitus with diabetic neuropathy, unspecified: Secondary | ICD-10-CM

## 2018-03-04 DIAGNOSIS — I482 Chronic atrial fibrillation, unspecified: Secondary | ICD-10-CM

## 2018-03-04 DIAGNOSIS — IMO0002 Reserved for concepts with insufficient information to code with codable children: Secondary | ICD-10-CM

## 2018-03-04 DIAGNOSIS — M79604 Pain in right leg: Secondary | ICD-10-CM

## 2018-03-04 DIAGNOSIS — I10 Essential (primary) hypertension: Secondary | ICD-10-CM

## 2018-03-04 DIAGNOSIS — E1165 Type 2 diabetes mellitus with hyperglycemia: Secondary | ICD-10-CM

## 2018-03-04 DIAGNOSIS — M79605 Pain in left leg: Secondary | ICD-10-CM

## 2018-03-04 LAB — POCT GLYCOSYLATED HEMOGLOBIN (HGB A1C): Hemoglobin A1C: 8.9 % — AB (ref 4.0–5.6)

## 2018-03-04 MED ORDER — ATORVASTATIN CALCIUM 40 MG PO TABS: 40.0000 mg | ORAL_TABLET | Freq: Every day | ORAL | 1 refills | Status: DC

## 2018-03-04 MED ORDER — DILTIAZEM HCL ER 120 MG PO CP24: 120.0000 mg | ORAL_CAPSULE | Freq: Every day | ORAL | 6 refills | Status: DC

## 2018-03-04 MED ORDER — LISINOPRIL-HYDROCHLOROTHIAZIDE 20-12.5 MG PO TABS: 1.0000 | ORAL_TABLET | Freq: Every day | ORAL | 6 refills | Status: DC

## 2018-03-04 MED ORDER — METFORMIN HCL 500 MG PO TABS
500.0000 mg | ORAL_TABLET | Freq: Two times a day (BID) | ORAL | 6 refills | Status: DC
Start: 2018-03-04 — End: 2018-08-01

## 2018-03-04 MED ORDER — CARVEDILOL 3.125 MG PO TABS: 3.1250 mg | ORAL_TABLET | Freq: Two times a day (BID) | ORAL | 6 refills | Status: DC

## 2018-03-04 NOTE — Progress Notes (Signed)
Subjective:    Patient ID: George Colon, male    DOB: 1966-07-31, 52 y.o.   MRN: [id number]  George Colon is a 52 y.o. male presenting on 03/04/2018 for Diabetes and Leg Pain (bilateral leg pain. Pt describe it as a tightiness senstation w/ severe muscle cramp)   HPI Diabetes Pt presents today for follow up of Type 2 diabetes mellitus. He is not checking CBG at home - Current diabetic medications include: none currently - previously well controlled - He is not currently symptomatic.  - He denies polydipsia, polyphagia, polyuria, headaches, diaphoresis, shakiness, chills, pain, numbness or tingling in extremities and changes in vision.   - Clinical course has been worsening. - He  reports no regular exercise routine. - His diet is high in salt, high in fat, and high in carbohydrates. - Weight trend: stable  PREVENTION: Eye exam current (within one year): no Foot exam current (within one year): no Lipid/ASCVD risk reduction - on statin: no Kidney protection - on ace or arb: yes Recent Labs    03/04/18 1536  HGBA1C 8.9*   6.7% on 01/29/2017  Hypertension - He is not checking BP at home or outside of clinic.    - Current medications: carvedilol 3.125 mg bid, lisinopril-HCTZ 20-12.5 mg daily, diltiazem 120 mg once daily, tolerating well without side effects - He is not currently symptomatic. - Pt denies headache, lightheadedness, dizziness, changes in vision, chest tightness/pressure, palpitations, leg swelling, sudden loss of speech or loss of consciousness.  Bilateral Leg Pain/Knee Pain Tightness in BLE, muscle cramps at night with severe pain  Has taken 2-3  81 mg aspirin for pain with relief.  He also takes Aleeve 440 mg bid for his pain.  He also notes his legs "feel like lead all the time," which is worse without his pain medication.   Social History   Tobacco Use  . Smoking status: Never Smoker  . Smokeless tobacco: Never Used  Substance Use Topics  . Alcohol use:  No  . Drug use: No    Review of Systems Per HPI unless specifically indicated above     Objective:    BP 132/72 (BP Location: Right Arm, Patient Position: Sitting, Cuff Size: Normal)   Pulse 75   Temp 98.1 F (36.7 C) (Oral)   Ht 6' (1.829 m)   Wt 289 lb 3.2 oz (131.2 kg)   BMI 39.22 kg/m   Wt Readings from Last 3 Encounters:  03/04/18 289 lb 3.2 oz (131.2 kg)  04/28/17 290 lb (131.5 kg)  04/26/17 290 lb (131.5 kg)    Physical Exam  Constitutional: He is oriented to person, place, and time. He appears well-developed and well-nourished. No distress.  HENT:  Head: Normocephalic and atraumatic.  Neck: Normal range of motion. Neck supple. Carotid bruit is not present.  Cardiovascular: Normal rate, regular rhythm, S1 normal, S2 normal, normal heart sounds and intact distal pulses.  Pulses:      Radial pulses are 2+ on the right side, and 2+ on the left side.       Popliteal pulses are 0 on the right side, and 0 on the left side.       Dorsalis pedis pulses are 0 on the right side, and 0 on the left side.       Posterior tibial pulses are 0 on the right side, and 0 on the left side.  Pulmonary/Chest: Effort normal and breath sounds normal. No respiratory distress.  Musculoskeletal: He exhibits  no edema (pedal).  Bilateral legs with normal musculoskeletal exam except bilateral knees Bilateral Knees Inspection: Normal appearance and symmetrical. No ecchymosis or effusion. Palpation: Non-tender. Bilateral crepitus ROM: Full active ROM bilaterally Special Testing: Lachman / Valgus/Varus tests negative with intact ligaments (ACL, MCL, LCL). McMurray negative without meniscus symptoms. Strength: 5/5 intact knee flex/ext, ankle dorsi/plantarflex Neurovascular: distally intact sensation light touch and cap refill  Neurological: He is alert and oriented to person, place, and time.  Skin: Skin is warm and dry. Capillary refill takes 2 to 3 seconds. Pedal. Upper extremity cap refill < 2  sec.  Psychiatric: His behavior is normal. Judgment and thought content normal. His mood appears anxious. His speech is tangential. He is communicative. He is attentive.  Vitals reviewed.  Diabetic Foot Form - Detailed   Diabetic Foot Exam - detailed Diabetic Foot exam was performed with the following findings:  Yes 03/04/2018  3:30 AM  Can the patient see the bottom of their feet?:  Yes Are the shoes appropriate in style and fit?:  Yes Is there swelling or and abnormal foot shape?:  No Is there a claw toe deformity?:  Yes (Comment: charcot foot bilaterally) Is there elevated skin temparature?:  No Is there foot or ankle muscle weakness?:  No Normal Range of Motion:  Yes Right posterior Tibialias:  Absent Left posterior Tibialias:  Absent  Right Dorsalis Pedis:  Absent Left Dorsalis Pedis:  Absent  Semmes-Weinstein Monofilament Test R Site 1-Great Toe:  Neg L Site 1-Great Toe:  Neg        Results for orders placed or performed in visit on 03/04/18  POCT glycosylated hemoglobin (Hb A1C)  Result Value Ref Range   Hemoglobin A1C 8.9 (A) 4.0 - 5.6 %   HbA1c, POC (prediabetic range)  5.7 - 6.4 %   HbA1c, POC (controlled diabetic range)  0.0 - 7.0 %      Assessment & Plan:   Problem List Items Addressed This Visit      Cardiovascular and Mediastinum   HTN (hypertension)    Controlled.  Tolerating medications well without side effects.  Complicated by morbid obesity.  Plan: 1. CONTINUE carvedilol 3.125 mg tablet twice daily with a meal 2. CONTINUE lisinopril - HCTZ 20-12.5 mg tablet once daily. 3. CONTINUE diltiazem 120 mg 1 tablet daily.   4. Check BP at home.   5. Recommended labs - orders placed 6. Follow up 3-6 months      Relevant Medications   carvedilol (COREG) 3.125 MG tablet   lisinopril-hydrochlorothiazide (PRINZIDE,ZESTORETIC) 20-12.5 MG tablet   diltiazem (DILACOR XR) 120 MG 24 hr capsule   atorvastatin (LIPITOR) 40 MG tablet   Atrial fibrillation (HCC)     Stable and rate controled on CCB.  The current medical regimen is effective;  continue present plan and medications.       Relevant Medications   carvedilol (COREG) 3.125 MG tablet   lisinopril-hydrochlorothiazide (PRINZIDE,ZESTORETIC) 20-12.5 MG tablet   diltiazem (DILACOR XR) 120 MG 24 hr capsule   atorvastatin (LIPITOR) 40 MG tablet     Endocrine   Uncontrolled diabetes mellitus with diabetic neuropathy (HCC) - Primary    Uncontrolled at A1c > 9%.  Pt was at goal per prior POCT HgbA1c = 6.7  Pt off medication and adhering to high glycemic diet.  Plan: 1. Continue metformin 1000 mg twice daily.  Education provided about mechanism of action of metformin and not required to be taken w/ meals.  Encouraged more regular meals. 2. Encouraged  pt to eat a low carb diet. 3. START atorvastatin 40 mg once daily.  Need lipid panel. Order placed and pt requested to have labs collected ASAP fasting. 4. Follow up 3-6 months.      Relevant Medications   metFORMIN (GLUCOPHAGE) 500 MG tablet   lisinopril-hydrochlorothiazide (PRINZIDE,ZESTORETIC) 20-12.5 MG tablet   atorvastatin (LIPITOR) 40 MG tablet   Other Relevant Orders   POCT glycosylated hemoglobin (Hb A1C) (Completed)   BASIC METABOLIC PANEL WITH GFR   Lipid panel     Other   Decreased pedal pulses    Pt with bilateral leg pain worst with activity and standing.  Assisted by NSAIDs.  W/ decreased pulses, uncontrolled DM, family hx of ASCVD I am concerned for claudication and atherosclerosis.  Plan to have arterial US lower extremity performed.  Pt with limited finances and currently declines.  Provided [place] financial assistance form.  Followup ASAP.  Will place order for patient once notified he would like to proceed.       Other Visit Diagnoses    Bilateral leg pain          Meds ordered this encounter  Medications  . metFORMIN (GLUCOPHAGE) 500 MG tablet    Sig: Take 1 tablet (500 mg total) by mouth 2 (two) times daily with a  meal.    Dispense:  60 tablet    Refill:  6    Order Specific Question:   Supervising Provider    Answer:   Smitty CordsKARAMALEGOS, ALEXANDER J [[id number]]  . carvedilol (COREG) 3.125 MG tablet    Sig: Take 1 tablet (3.125 mg total) by mouth 2 (two) times daily with a meal.    Dispense:  60 tablet    Refill:  6    Order Specific Question:   Supervising Provider    Answer:   Smitty CordsKARAMALEGOS, ALEXANDER J [[id number]]  . lisinopril-hydrochlorothiazide (PRINZIDE,ZESTORETIC) 20-12.5 MG tablet    Sig: Take 1 tablet by mouth daily.    Dispense:  30 tablet    Refill:  6    Order Specific Question:   Supervising Provider    Answer:   Smitty CordsKARAMALEGOS, ALEXANDER J [[id number]]  . diltiazem (DILACOR XR) 120 MG 24 hr capsule    Sig: Take 1 capsule (120 mg total) by mouth daily.    Dispense:  30 capsule    Refill:  6    Order Specific Question:   Supervising Provider    Answer:   Smitty CordsKARAMALEGOS, ALEXANDER J [[id number]]  . atorvastatin (LIPITOR) 40 MG tablet    Sig: Take 1 tablet (40 mg total) by mouth daily.    Dispense:  90 tablet    Refill:  1    Order Specific Question:   Supervising Provider    Answer:   Smitty CordsKARAMALEGOS, ALEXANDER J [[id number]]    Follow up plan: Return in about 6 months (around 09/03/2018) for diabetes, hypertension.  George McardleLauren Marguerette Sheller, DNP, AGPCNP-BC Adult Gerontology Primary Care Nurse Practitioner [facility] 03/05/2018, 4:52 PM

## 2018-03-04 NOTE — Patient Instructions (Addendum)
George Colon,   Thank you for coming in to clinic today.  1. Continue prior medications without changes.  - START atorvastatin 40 mg daily  2. I recommend an artery study for your legs for pain.  There could be artery blockage in legs with decreased pulses.  3. Labs next week.  Come fasting nothing for 8 hours after midnight.  Come as soon as possible M-F 8a-11:30.   Please schedule a follow-up appointment with Wilhelmina McardleLauren Jarmal Lewelling, AGNP. Return in about 6 months (around 09/03/2018) for diabetes, hypertension.  If you have any other questions or concerns, please feel free to call the clinic or send a message through MyChart. You may also schedule an earlier appointment if necessary.  You will receive a survey after today's visit either digitally by e-mail or paper by [employer] mail. Your experiences and feedback matter to us.  Please respond so we know how we are doing as we provide care for you.   Wilhelmina McardleLauren Meliza Kage, DNP, AGNP-BC Adult Gerontology Nurse Practitioner [facility], [facility]

## 2018-03-05 ENCOUNTER — Encounter: Payer: Self-pay | Admitting: Nurse Practitioner

## 2018-03-05 DIAGNOSIS — R0989 Other specified symptoms and signs involving the circulatory and respiratory systems: Secondary | ICD-10-CM | POA: Insufficient documentation

## 2018-03-05 NOTE — Assessment & Plan Note (Signed)
Controlled.  Tolerating medications well without side effects.  Complicated by morbid obesity.  Plan: 1. CONTINUE carvedilol 3.125 mg tablet twice daily with a meal 2. CONTINUE lisinopril - HCTZ 20-12.5 mg tablet once daily. 3. CONTINUE diltiazem 120 mg 1 tablet daily.   4. Check BP at home.   5. Recommended labs - orders placed 6. Follow up 3-6 months

## 2018-03-05 NOTE — Assessment & Plan Note (Addendum)
Pt with bilateral leg pain worst with activity and standing.  Assisted by NSAIDs.  W/ decreased pulses, uncontrolled DM, family hx of ASCVD I am concerned for claudication and atherosclerosis.  Plan to have arterial US lower extremity performed.  Pt with limited finances and currently declines.  Provided [place] financial assistance form.  Followup ASAP.  Will place order for patient once notified he would like to proceed.

## 2018-03-05 NOTE — Assessment & Plan Note (Signed)
Uncontrolled at A1c > 9%.  Pt was at goal per prior POCT HgbA1c = 6.7  Pt off medication and adhering to high glycemic diet.  Plan: 1. Continue metformin 1000 mg twice daily.  Education provided about mechanism of action of metformin and not required to be taken w/ meals.  Encouraged more regular meals. 2. Encouraged pt to eat a low carb diet. 3. START atorvastatin 40 mg once daily.  Need lipid panel. Order placed and pt requested to have labs collected ASAP fasting. 4. Follow up 3-6 months.

## 2018-03-05 NOTE — Assessment & Plan Note (Signed)
Stable and rate controled on CCB.  The current medical regimen is effective;  continue present plan and medications.

## 2018-03-07 ENCOUNTER — Telehealth: Payer: Self-pay

## 2018-03-07 NOTE — Telephone Encounter (Signed)
The pt called to find out if he suppose to be on the Diltiazem. He said that his bp was okay in the office and doesn't remember discussing with you about him taking that medication. I see the patient had this medication on his list prior, but he was not currently taking the medication. Please advise

## 2018-03-07 NOTE — Telephone Encounter (Signed)
He can stop the diltiazem if he was not previously on this.  I have discontinued this from his medication list.

## 2018-03-07 NOTE — Telephone Encounter (Signed)
Attempted to contact the patient, no answer. Left a detail message on the patient vm to call me back if she got any questions.

## 2018-03-17 ENCOUNTER — Other Ambulatory Visit: Payer: Self-pay

## 2018-03-17 LAB — BASIC METABOLIC PANEL WITH GFR
BUN: 11 mg/dL (ref 7–25)
CO2: 29 mmol/L (ref 20–32)
Calcium: 8.8 mg/dL (ref 8.6–10.3)
Chloride: 105 mmol/L (ref 98–110)
Creat: 0.73 mg/dL (ref 0.70–1.33)
GFR, Est African American: 124 mL/min/{1.73_m2} (ref >=60)
GFR, Est Non African American: 107 mL/min/{1.73_m2} (ref >=60)
Glucose, Bld: 174 mg/dL — ABNORMAL HIGH (ref 65–99)
Potassium: 3.9 mmol/L (ref 3.5–5.3)
Sodium: 140 mmol/L (ref 135–146)

## 2018-03-17 LAB — LIPID PANEL
Cholesterol: 108 mg/dL (ref <200)
HDL: 29 mg/dL — ABNORMAL LOW (ref >40)
LDL Cholesterol (Calc): 65 mg/dL (calc)
Non-HDL Cholesterol (Calc): 79 mg/dL (calc) (ref <130)
Total CHOL/HDL Ratio: 3.7 (calc) (ref <5.0)
Triglycerides: 67 mg/dL (ref <150)

## 2018-04-24 ENCOUNTER — Emergency Department
Admission: EM | Admit: 2018-04-24 | Discharge: 2018-04-24 | Disposition: A | Discharge status: Home / Self Care | Payer: Self-pay | Attending: Emergency Medicine | Admitting: Emergency Medicine

## 2018-04-24 ENCOUNTER — Encounter: Payer: Self-pay | Admitting: Emergency Medicine

## 2018-04-24 DIAGNOSIS — L089 Local infection of the skin and subcutaneous tissue, unspecified: Secondary | ICD-10-CM

## 2018-04-24 DIAGNOSIS — Z79899 Other long term (current) drug therapy: Secondary | ICD-10-CM | POA: Insufficient documentation

## 2018-04-24 DIAGNOSIS — L723 Sebaceous cyst: Secondary | ICD-10-CM | POA: Insufficient documentation

## 2018-04-24 DIAGNOSIS — I10 Essential (primary) hypertension: Secondary | ICD-10-CM | POA: Insufficient documentation

## 2018-04-24 DIAGNOSIS — Z7984 Long term (current) use of oral hypoglycemic drugs: Secondary | ICD-10-CM | POA: Insufficient documentation

## 2018-04-24 DIAGNOSIS — E1140 Type 2 diabetes mellitus with diabetic neuropathy, unspecified: Secondary | ICD-10-CM | POA: Insufficient documentation

## 2018-04-24 LAB — GLUCOSE, CAPILLARY: GLUCOSE-CAPILLARY: 404 mg/dL — AB (ref 70–99)

## 2018-04-24 MED ORDER — LIDOCAINE HCL (PF) 1 % IJ SOLN
INTRAMUSCULAR | Status: AC
  Administered 2018-04-24: 5 mL
  Filled 2018-04-24: qty 5

## 2018-04-24 MED ORDER — LIDOCAINE HCL 1 % IJ SOLN
5.0000 mL | Freq: Once | INTRAMUSCULAR | Status: AC
  Administered 2018-04-24: 5 mL
  Filled 2018-04-24: qty 5

## 2018-04-24 MED ORDER — CLINDAMYCIN HCL 300 MG PO CAPS: 300.0000 mg | ORAL_CAPSULE | Freq: Three times a day (TID) | ORAL | 0 refills | Status: AC

## 2018-04-24 NOTE — ED Notes (Addendum)
Patient has abscess on LLQ. Hard, red, and warm to touch. Patient unsure how long this has been present. Patient also c/o increased urination and increased work to breathe. Patient states he takes metformin daily but has not re-filled his prescription in a couple of days. Patients blood sugar was 404. Patient states "I did just pig out and eat a ton of food"

## 2018-04-24 NOTE — ED Triage Notes (Signed)
First Nurse Note:  C/O raised circular firm area to LUQ abdomen.

## 2018-04-24 NOTE — ED Triage Notes (Signed)
Patient states "I've got this place on my stomach for, "a while".  Patient states in the past week area has become larger, and "harder" and more noticeable.  Patient is unable to pin down whether area has been there for weeks or months.

## 2018-04-24 NOTE — ED Provider Notes (Signed)
[facility] Emergency Department Provider Note  ____________________________________________  Time seen: Approximately 4:10 PM  I have reviewed the triage vital signs and the nursing notes.   HISTORY  Chief Complaint Abscess    HPI George Colon is a 52 y.o. male with a history of uncontrolled diabetes, presents to the emergency department with 2-1/2 cm of circumferential cellulitis and induration along the left upper quadrant of the abdomen.  Patient reports that symptoms have been present for approximately 1 week.  He has noticed that the skin has been red and warm to the touch.  He denies fever and chills.  He denies a history of cutaneous abscesses.  No alleviating measures have been attempted.   Past Medical History:  Diagnosis Date  . Anxiety   . Depression   . Diabetes mellitus without complication (HCC)   . Hypertension     Patient Active Problem List   Diagnosis Date Noted  . Decreased pedal pulses 03/05/2018  . Uncontrolled diabetes mellitus with diabetic neuropathy (HCC) 07/02/2015  . HTN (hypertension) 07/02/2015  . Atrial fibrillation (HCC) 07/02/2015    History reviewed. No pertinent surgical history.  Prior to Admission medications   Medication Sig Start Date End Date Taking? Authorizing Provider  atorvastatin (LIPITOR) 40 MG tablet Take 1 tablet (40 mg total) by mouth daily. 03/04/18   Galen Manila, NP  carvedilol (COREG) 3.125 MG tablet Take 1 tablet (3.125 mg total) by mouth 2 (two) times daily with a meal. 03/04/18   Galen Manila, NP  clindamycin (CLEOCIN) 300 MG capsule Take 1 capsule (300 mg total) by mouth 3 (three) times daily for 10 days. 04/24/18 05/04/18  Orvil Feil, PA-C  lisinopril-hydrochlorothiazide (PRINZIDE,ZESTORETIC) 20-12.5 MG tablet Take 1 tablet by mouth daily. 03/04/18   Galen Manila, NP  metFORMIN (GLUCOPHAGE) 500 MG tablet Take 1 tablet (500 mg total) by mouth 2 (two) times daily with a  meal. 03/04/18   Galen Manila, NP  naproxen sodium (ANAPROX) 220 MG tablet Take 440 mg by mouth 2 (two) times daily with a meal.    [provider]  sildenafil (REVATIO) 20 MG tablet Take 1-5 tablets as needed before sex. 10/12/16   Janeann Forehand., MD    Allergies Patient has no known allergies.  Family History  Problem Relation Age of Onset  . Heart disease Mother     Social History Social History   Tobacco Use  . Smoking status: Never Smoker  . Smokeless tobacco: Never Used  Substance Use Topics  . Alcohol use: No  . Drug use: No     Review of Systems  Constitutional: No fever/chills Eyes: No visual changes. No discharge ENT: No upper respiratory complaints. Cardiovascular: no chest pain. Respiratory: no cough. No SOB. Gastrointestinal: No abdominal pain.  No nausea, no vomiting.  No diarrhea.  No constipation. Musculoskeletal: Negative for musculoskeletal pain. Skin: Patient has abdominal abscess. Neurological: Negative for headaches, focal weakness or numbness.   ____________________________________________   PHYSICAL EXAM:  VITAL SIGNS: ED Triage Vitals  Enc Vitals Group     BP 04/24/18 1434 116/85     Pulse Rate 04/24/18 1434 97     Resp 04/24/18 1434 18     Temp 04/24/18 1434 98.9 F (37.2 C)     Temp Source 04/24/18 1434 Oral     SpO2 04/24/18 1434 96 %     Weight 04/24/18 1435 294 lb (133.4 kg)     Height 04/24/18 1435  6' (1.829 m)     Head Circumference --      Peak Flow --      Pain Score 04/24/18 1434 0     Pain Loc --      Pain Edu? --      Excl. in GC? --      Constitutional: Alert and oriented. Well appearing and in no acute distress.  Patient is relaxed and talkative.  He discusses his goal of buying property in [place]. Cardiovascular: Normal rate, regular rhythm. Normal S1 and S2.  Good peripheral circulation. Respiratory: Normal respiratory effort without tachypnea or retractions. Lungs CTAB. Good air entry to the  bases with no decreased or absent breath sounds. Gastrointestinal: Bowel sounds 4 quadrants. Soft and nontender to palpation. No guarding or rigidity. No palpable masses. No distention. No CVA tenderness. Musculoskeletal: Full range of motion to all extremities. No gross deformities appreciated. Neurologic:  Normal speech and language. No gross focal neurologic deficits are appreciated.  Skin: To inspection, a region of skin in the left upper quadrant has 2 cm of circumferential erythema with associated fluctuance and induration. Psychiatric: Mood and affect are normal. Speech and behavior are normal. Patient exhibits appropriate insight and judgement.   ____________________________________________   LABS (all labs ordered are listed, but only abnormal results are displayed)  Labs Reviewed  GLUCOSE, CAPILLARY - Abnormal; Notable for the following components:      Result Value   Glucose-Capillary 404 (*)    All other components within normal limits   ____________________________________________  EKG   ____________________________________________  RADIOLOGY   No results found.  ____________________________________________    PROCEDURES  Procedure(s) performed:    Procedures  INCISION AND DRAINAGE Performed by: Orvil Feil Consent: Verbal consent obtained. Risks and benefits: risks, benefits and alternatives were discussed Type: abscess  Body area: Left upper quadrant (abdomen)  Anesthesia: local infiltration  Incision was made with a scalpel.  Local anesthetic: lidocaine 1% without epinephrine  Anesthetic total: 2 ml  Complexity: complex Blunt dissection to break up loculations  Drainage: purulent  Drainage amount: Copious  Patient tolerance: Patient tolerated the procedure well with no immediate complications.     Medications  lidocaine (XYLOCAINE) 1 % (with pres) injection 5 mL (has no administration in time range)  lidocaine (PF)  (XYLOCAINE) 1 % injection (has no administration in time range)     ____________________________________________   INITIAL IMPRESSION / ASSESSMENT AND PLAN / ED COURSE  Pertinent labs & imaging results that were available during my care of the patient were reviewed by me and considered in my medical decision making (see chart for details).  Review of the [place] CSRS was performed in accordance of the [facility] prior to dispensing any controlled drugs.    Assessment and plan Patient presents to the emergency department with 2-1/2 cm of circumferential cellulitis at left upper quadrant of abdomen.  Patient underwent incision and drainage which yielded exudate consistent with an infected sebaceous cyst.  Given uncontrolled diabetes and size of cellulitis, patient was treated empirically with clindamycin.  He was advised to follow-up with primary care as needed.  All patient questions were answered.     ____________________________________________  FINAL CLINICAL IMPRESSION(S) / ED DIAGNOSES  Final diagnoses:  Infected sebaceous cyst      NEW MEDICATIONS STARTED DURING THIS VISIT:  ED Discharge Orders        Ordered    clindamycin (CLEOCIN) 300 MG capsule  3 times daily     04/24/18 1607  This chart was dictated using voice recognition software/Dragon. Despite best efforts to proofread, errors can occur which can change the meaning. Any change was purely unintentional.    Orvil FeilWoods, Jolly Bleicher M, PA-C 04/24/18 1614    Sharman CheekStafford, Phillip, MD 04/25/18 [id number]

## 2018-05-24 ENCOUNTER — Encounter: Payer: Self-pay | Admitting: Nurse Practitioner

## 2018-05-24 ENCOUNTER — Ambulatory Visit (INDEPENDENT_AMBULATORY_CARE_PROVIDER_SITE_OTHER): Payer: Self-pay | Admitting: Nurse Practitioner

## 2018-05-24 ENCOUNTER — Other Ambulatory Visit: Payer: Self-pay

## 2018-05-24 VITALS — BP 107/69 | HR 62 | Temp 97.9°F | Ht 72.0 in | Wt 288.4 lb

## 2018-05-24 DIAGNOSIS — K047 Periapical abscess without sinus: Secondary | ICD-10-CM

## 2018-05-24 MED ORDER — AMOXICILLIN 500 MG PO TABS: 1000.0000 mg | ORAL_TABLET | Freq: Two times a day (BID) | ORAL | 0 refills | Status: DC

## 2018-05-24 NOTE — Patient Instructions (Addendum)
George Colon,   Thank you for coming in to clinic today.  START amoxicillin 1000 mg twice daily for 10 days.  You must have dental care to follow up today's visit! OPTIONS FOR DENTAL FOLLOW UP CARE  [place] Department of Health and Human Services - Local Safety Net Dental Clinics TripDoors.comhttp://www.ncdhhs.gov/dph/oralhealth/services/safetynetclinics.htm  [facility] [phone])  [facility] [phone])  [facility] [phone]  [facility] [phone])  [facility] [phone]) This clinic caters to the indigent population and is on a lottery system. Location: [employer] [address], [address], [place] Clinic Hours: Wednesdays from 6pm - 9pm, patients seen by a lottery system. For dates, call or go to [email or web address] Services: Cleanings, fillings and simple extractions. Payment Options: DENTAL WORK IS FREE OF CHARGE. Bring proof of income or support. Best way to get seen: Arrive at 5:15 pm - this is a lottery, NOT first come/first serve, so arriving earlier will not increase your chances of being seen.   [facility] [phone] Select option 1 for emergencies  Location: [facility], [place] [address], [address], [place] Clinic Hours: No walk-ins accepted - call the day before to schedule an appointment. Check in times are 9:30 am and 1:30 pm. Services: Simple extractions, temporary fillings, pulpectomy/pulp debridement, uncomplicated abscess drainage. Payment Options: PAYMENT IS DUE AT THE TIME OF SERVICE. Fee is usually $100-200, additional surgical procedures (e.g. abscess drainage) may be extra. Cash, checks, Visa/MasterCard accepted. Can file Medicaid if patient is covered for dental - patient should call case worker to check. No discount for [facility] patients. Best way to get  seen: MUST call the day before and get onto the schedule. Can usually be seen the next 1-2 days. No walk-ins accepted.   [facility] [phone]  Location: [facility], [address], [place] Clinic Hours: M, W, Th, F 8am or 1:30pm, Tues 9a or 1:30 - first come/first served. Services: Simple extractions, temporary fillings, uncomplicated abscess drainage. You do not need to be an [place] resident. Payment Options: PAYMENT IS DUE AT THE TIME OF SERVICE. Dental insurance, otherwise sliding scale - bring proof of income or support. Depending on income and treatment needed, cost is usually $50-200. Best way to get seen: Arrive early as it is first come/first served.   [facility] [phone]  Location: [address] Clinic Hours: Mon-Thu 8a-5p Services: Most basic dental services including extractions and fillings. Payment Options: PAYMENT IS DUE AT THE TIME OF SERVICE. Sliding scale, up to 50% off - bring proof if income or support. Medicaid with dental option accepted. Best way to get seen: Call to schedule an appointment, can usually be seen within 2 weeks OR they will try to see walk-ins - show up at 8a or 2p (you may have to wait).   [facility] [phone] [place] RESIDENTS ONLY  Location: [facility], [address], [place], [place] [id number] Clinic Hours: By appointment only. Monday - Thursday 8am-5pm, Friday 8am-12pm Services: Cleanings, fillings, extractions. Payment Options: PAYMENT IS DUE AT THE TIME OF SERVICE. Cash, Visa or MasterCard. Sliding scale - $30 minimum per service. Best way to get seen: Come in to office, complete packet and make an appointment - need proof of income or support monies for each household member and proof of [place] residence. Usually takes about a month to  get  in.   [facility] [phone]  Location: [address] [facility] Clinic Hours: Walk-in Urgent Care Dental Services are offered Monday-Friday mornings only. The numbers of emergencies accepted daily is limited to the number of providers available. Maximum 15 - Mondays, Wednesdays & Thursdays Maximum 10 - Tuesdays & Fridays Services: You do not need to be a [facility] resident to be seen for a dental emergency. Emergencies are defined as pain, swelling, abnormal bleeding, or dental trauma. Walkins will receive x-rays if needed. NOTE: Dental cleaning is not an emergency. Payment Options: PAYMENT IS DUE AT THE TIME OF SERVICE. Minimum co-pay is $40.00 for uninsured patients. Minimum co-pay is $3.00 for Medicaid with dental coverage. Dental Insurance is accepted and must be presented at time of visit. Medicare does not cover dental. Forms of payment: Cash, credit card, checks. Best way to get seen: If not previously registered with the clinic, walk-in dental registration begins at 7:15 am and is on a first come/first serve basis. If previously registered with the clinic, call to make an appointment.   [facility] [phone] [place] RESIDENTS ONLY  Location: [address] [place], [place] Clinic Hours: Mon-Thu 10a-2p Services: Extractions only! Payment Options: FREE (donations accepted) - bring proof of income or support Best way to get seen: Call and schedule an appointment OR come at 8am on the 1st Monday of every month (except for holidays) when it is first come/first served.   [facility] [phone]  Location: [address] [place], [place] Clinic Hours: Friday mornings Services, Payment Options, Best way to get seen: Call for info   Please schedule a follow-up appointment with [name], AGNP. Return if symptoms worsen or fail to improve.  If you have any other questions or concerns, please  feel free to call the clinic or send a message through MyChart. You may also schedule an earlier appointment if necessary.  You will receive a survey after today's visit either digitally by e-mail or paper by [employer]. Your experiences and feedback matter to [name].  Please respond so we know how we are doing as we provide care for you.   [name], DNP, AGNP-BC Adult Gerontology Nurse Practitioner [facility], [facility]

## 2018-05-24 NOTE — Progress Notes (Signed)
Subjective:    Patient ID: George Colon, male    DOB: 03/28/1966, 52 y.o.   MRN: [id number]  George Colon is a 52 y.o. male presenting on 05/24/2018 for Dental Pain (pt state chronic tooth pain. He's requesting abx to help with the pain.)   HPI Dental Pain Patient has had long-standing dental problems.  Over last 2 weeks, oral pain has increased significantly.  Patient has used salt water rinse, hydrogen peroxide, pain gels for relief.  He reports some relief in mouth after taking Clindamycin Rx on 04/24/2018 for infected sebaceous cyst.   - Pt denies fever, chills, sweats, nausea, vomiting, diarrhea and constipation.  Social History   Tobacco Use  . Smoking status: Never Smoker  . Smokeless tobacco: Never Used  Substance Use Topics  . Alcohol use: No  . Drug use: No    Review of Systems Per HPI unless specifically indicated above     Objective:    BP 107/69 (BP Location: Right Arm, Patient Position: Sitting, Cuff Size: Large)   Pulse 62   Temp 97.9 F (36.6 C) (Oral)   Ht 6' (1.829 m)   Wt 288 lb 6.4 oz (130.8 kg)   BMI 39.11 kg/m   Wt Readings from Last 3 Encounters:  05/24/18 288 lb 6.4 oz (130.8 kg)  04/24/18 294 lb (133.4 kg)  03/04/18 289 lb 3.2 oz (131.2 kg)    Physical Exam  Constitutional: He is oriented to person, place, and time. He appears well-developed and well-nourished. No distress.  HENT:  Head: Normocephalic and atraumatic.  Mouth/Throat:    Cardiovascular: Normal rate, regular rhythm, S1 normal, S2 normal, normal heart sounds and intact distal pulses.  Pulmonary/Chest: Effort normal and breath sounds normal. No respiratory distress.  Neurological: He is alert and oriented to person, place, and time.  Skin: Skin is warm and dry.  Psychiatric: He has a normal mood and affect. His behavior is normal.  Vitals reviewed.   Results for orders placed or performed during the hospital encounter of 04/24/18  Glucose, capillary  Result Value Ref  Range   Glucose-Capillary 404 (H) 70 - 99 mg/dL   Comment 1 Notify RN    Comment 2 Document in Chart       Assessment & Plan:   Problem List Items Addressed This Visit    None    Visit Diagnoses    Dental abscess    -  Primary   Relevant Medications   amoxicillin (AMOXIL) 500 MG tablet    Chronic dental problems.  Acute abscess of upper right incisor or first molar and lower incisor and first molar.  Patient without systemic signs and symptoms of infection.  Plan: 1. START amoxicillin 1000 mg twice daily for 10 days.  Take two (2) 500 mg tablets twice daily.  2. Patient must have dental work followup for full mouth tooth extraction.  Likely will need oral surgery to complete this process.  - Dental resources provided to patient today are limited.  May be other local options. 3. Followup prn.  Meds ordered this encounter  Medications  . amoxicillin (AMOXIL) 500 MG tablet    Sig: Take 2 tablets (1,000 mg total) by mouth 2 (two) times daily.    Dispense:  40 tablet    Refill:  0    Order Specific Question:   Supervising Provider    Answer:   Smitty Cords [[id number]]    Follow up plan: Return if symptoms worsen or fail  to improve.  Wilhelmina McardleLauren Jaquez Farrington, DNP, AGPCNP-BC Adult Gerontology Primary Care Nurse Practitioner [facility] 05/24/2018, 8:08 AM

## 2018-07-29 ENCOUNTER — Ambulatory Visit: Payer: Self-pay | Admitting: Nurse Practitioner

## 2018-08-01 ENCOUNTER — Encounter: Payer: Self-pay | Admitting: Nurse Practitioner

## 2018-08-01 ENCOUNTER — Other Ambulatory Visit: Payer: Self-pay

## 2018-08-01 ENCOUNTER — Ambulatory Visit (INDEPENDENT_AMBULATORY_CARE_PROVIDER_SITE_OTHER): Payer: Self-pay | Admitting: Nurse Practitioner

## 2018-08-01 VITALS — BP 117/71 | HR 75 | Temp 97.9°F | Ht 72.0 in | Wt 285.2 lb

## 2018-08-01 DIAGNOSIS — IMO0002 Reserved for concepts with insufficient information to code with codable children: Secondary | ICD-10-CM

## 2018-08-01 DIAGNOSIS — E1140 Type 2 diabetes mellitus with diabetic neuropathy, unspecified: Secondary | ICD-10-CM

## 2018-08-01 DIAGNOSIS — E1165 Type 2 diabetes mellitus with hyperglycemia: Secondary | ICD-10-CM

## 2018-08-01 LAB — POCT GLYCOSYLATED HEMOGLOBIN (HGB A1C): Hemoglobin A1C: 12 % — AB (ref 4.0–5.6)

## 2018-08-01 MED ORDER — METFORMIN HCL 1000 MG PO TABS: 1000.0000 mg | ORAL_TABLET | Freq: Two times a day (BID) | ORAL | 5 refills | Status: DC

## 2018-08-01 MED ORDER — RELION LANCETS STANDARD 21G MISC: 1.0000 | Freq: Every day | 3 refills | Status: DC

## 2018-08-01 NOTE — Patient Instructions (Addendum)
George Colon.,   Thank you for coming in to clinic today.  1. If worsening diarrhea, call clinic for additional advice.  2. Increase metformin to 1,000 mg twice daily.   - For the next week, increase to 1,000 mg at breakfast.   Then, in two weeks, go to full dose for dinner as well.  3. Continue lisinopril-HCTZ  4. Continue atorvastatin.  www.diabetes.org - www.hormone.org/  Please schedule a follow-up appointment with George Colon, AGNP. Return in about 3 months (around 11/01/2018) for diabetes.  If you have any other questions or concerns, please feel free to call the clinic or send a message through MyChart. You may also schedule an earlier appointment if necessary.  You will receive a survey after today's visit either digitally by e-mail or paper by [employer]. Your experiences and feedback matter to [name].  Please respond so we know how we are doing as we provide care for you.   George Mcardle, DNP, AGNP-BC Adult Gerontology Nurse Practitioner [facility], [facility]   Diabetes Mellitus and Nutrition When you have diabetes (diabetes mellitus), it is very important to have healthy eating habits because your blood sugar (glucose) levels are greatly affected by what you eat and drink. Eating healthy foods in the appropriate amounts, at about the same times every day, can help you:  Control your blood glucose.  Lower your risk of heart disease.  Improve your blood pressure.  Reach or maintain a healthy weight.  Every person with diabetes is different, and each person has different needs for a meal plan. Your health care provider may recommend that you work with a diet and nutrition specialist (dietitian) to make a meal plan that is best for you. Your meal plan may vary depending on factors such as:  The calories you need.  The medicines you take.  Your weight.  Your blood glucose, blood pressure, and cholesterol levels.  Your activity  level.  Other health conditions you have, such as heart or kidney disease.  How do carbohydrates affect me? Carbohydrates affect your blood glucose level more than any other type of food. Eating carbohydrates naturally increases the amount of glucose in your blood. Carbohydrate counting is a method for keeping track of how many carbohydrates you eat. Counting carbohydrates is important to keep your blood glucose at a healthy level, especially if you use insulin or take certain oral diabetes medicines. It is important to know how many carbohydrates you can safely have in each meal. This is different for every person. Your dietitian can help you calculate how many carbohydrates you should have at each meal and for snack. Foods that contain carbohydrates include:  Bread, cereal, rice, pasta, and crackers.  Potatoes and corn.  Peas, beans, and lentils.  Milk and yogurt.  Fruit and juice.  Desserts, such as cakes, cookies, ice cream, and candy.  How does alcohol affect me? Alcohol can cause a sudden decrease in blood glucose (hypoglycemia), especially if you use insulin or take certain oral diabetes medicines. Hypoglycemia can be a life-threatening condition. Symptoms of hypoglycemia (sleepiness, dizziness, and confusion) are similar to symptoms of having too much alcohol. If your health care provider says that alcohol is safe for you, follow these guidelines:  Limit alcohol intake to no more than 1 drink per day for nonpregnant women and 2 drinks per day for men. One drink equals 12 oz of beer, 5 oz of wine, or 1 oz of hard liquor.  Do not drink on an empty stomach.  Keep yourself hydrated with water, diet soda, or unsweetened iced tea.  Keep in mind that regular soda, juice, and other mixers may contain a lot of sugar and must be counted as carbohydrates.  What are tips for following this plan? Reading food labels  Start by checking the serving size on the label. The amount of  calories, carbohydrates, fats, and other nutrients listed on the label are based on one serving of the food. Many foods contain more than one serving per package.  Check the total grams (g) of carbohydrates in one serving. You can calculate the number of servings of carbohydrates in one serving by dividing the total carbohydrates by 15. For example, if a food has 30 g of total carbohydrates, it would be equal to 2 servings of carbohydrates.  Check the number of grams (g) of saturated and trans fats in one serving. Choose foods that have low or no amount of these fats.  Check the number of milligrams (mg) of sodium in one serving. Most people should limit total sodium intake to less than 2,300 mg per day.  Always check the nutrition information of foods labeled as "low-fat" or "nonfat". These foods may be higher in added sugar or refined carbohydrates and should be avoided.  Talk to your dietitian to identify your daily goals for nutrients listed on the label. Shopping  Avoid buying canned, premade, or processed foods. These foods tend to be high in fat, sodium, and added sugar.  Shop around the outside edge of the grocery store. This includes fresh fruits and vegetables, bulk grains, fresh meats, and fresh dairy. Cooking  Use low-heat cooking methods, such as baking, instead of high-heat cooking methods like deep frying.  Cook using healthy oils, such as olive, canola, or sunflower oil.  Avoid cooking with butter, cream, or high-fat meats. Meal planning  Eat meals and snacks regularly, preferably at the same times every day. Avoid going long periods of time without eating.  Eat foods high in fiber, such as fresh fruits, vegetables, beans, and whole grains. Talk to your dietitian about how many servings of carbohydrates you can eat at each meal.  Eat 4-6 ounces of lean protein each day, such as lean meat, chicken, fish, eggs, or tofu. 1 ounce is equal to 1 ounce of meat, chicken, or fish,  1 egg, or 1/4 cup of tofu.  Eat some foods each day that contain healthy fats, such as avocado, nuts, seeds, and fish. Lifestyle   Check your blood glucose regularly.  Exercise at least 30 minutes 5 or more days each week, or as told by your health care provider.  Take medicines as told by your health care provider.  Do not use any products that contain nicotine or tobacco, such as cigarettes and e-cigarettes. If you need help quitting, ask your health care provider.  Work with a Veterinary surgeon or diabetes educator to identify strategies to manage stress and any emotional and social challenges. What are some questions to ask my health care provider?  Do I need to meet with a diabetes educator?  Do I need to meet with a dietitian?  What number can I call if I have questions?  When are the best times to check my blood glucose? Where to find more information:  American Diabetes Association: diabetes.org/food-and-fitness/food  Academy of Nutrition and Dietetics: https://www.vargas.com/  [employer] of Diabetes and Digestive and Kidney Diseases (NIH): FindJewelers.cz Summary  A healthy meal plan  will help you control your blood glucose and maintain a healthy lifestyle.  Working with a diet and nutrition specialist (dietitian) can help you make a meal plan that is best for you.  Keep in mind that carbohydrates and alcohol have immediate effects on your blood glucose levels. It is important to count carbohydrates and to use alcohol carefully. This information is not intended to replace advice given to you by your health care provider. Make sure you discuss any questions you have with your health care provider. Document Released: 06/11/2005 Document Revised: 10/19/2016 Document Reviewed: 10/19/2016 Elsevier Interactive Patient Education  [employer].

## 2018-08-01 NOTE — Progress Notes (Signed)
Subjective:    Patient ID: George Colon., male    DOB: August 03, 1966, 52 y.o.   MRN: [id number]  George Colon Dec. is a 52 y.o. male presenting on 08/01/2018 for Diabetes (elevated blood high 436. Ranging 222- 300. Frequent urination)   HPI Diabetes Pt presents today for follow up of Type 2 diabetes mellitus. He is not checking CBG at home regularly, but has recently gotten batteries for meter replacement.  After soda, was 436.  Has started checking in early am 220-240 over 7 days.  Is trending downward with being back on metformin.  Stops taking r/t saving money. - Current diabetic medications include: metformin 500 mg bid - He is symptomatic with polyuria.   Has intermittent pain, numbness and tingling in feet. - He denies polydipsia, polyphagia, headaches, diaphoresis, shakiness, chills and changes in vision.   - Clinical course has been worsening. - He  reports an exercise routine that includes walking, 2-3 days per week. - His diet is moderate in salt, moderate in fat, and moderate in carbohydrates.  - Weight trend: decreasing steadily - Patient has goal to move to [place] and get off medications.  PREVENTION: Eye exam current (within one year): no Foot exam current (within one year): yes  Lipid/ASCVD risk reduction - on statin: on atorvastatin Kidney protection - on ace or arb: yes - lisinopril Recent Labs    03/04/18 1536  HGBA1C 8.9*   Social History   Tobacco Use  . Smoking status: Never Smoker  . Smokeless tobacco: Never Used  Substance Use Topics  . Alcohol use: No  . Drug use: No    Review of Systems Per HPI unless specifically indicated above     Objective:    BP 117/71   Pulse 75   Temp 97.9 F (36.6 C) (Oral)   Ht 6' (1.829 m)   Wt 285 lb 3.2 oz (129.4 kg)   BMI 38.68 kg/m   Wt Readings from Last 3 Encounters:  08/01/18 285 lb 3.2 oz (129.4 kg)  05/24/18 288 lb 6.4 oz (130.8 kg)  04/24/18 294 lb (133.4 kg)    Physical Exam   Constitutional: He is oriented to person, place, and time. He appears well-developed and well-nourished. No distress.  HENT:  Head: Normocephalic and atraumatic.  Neck: Normal range of motion. Neck supple. Carotid bruit is not present.  Cardiovascular: Normal rate, regular rhythm, S1 normal, S2 normal, normal heart sounds and intact distal pulses.  Pulmonary/Chest: Effort normal and breath sounds normal. No respiratory distress.  Musculoskeletal: He exhibits no edema (pedal).  Neurological: He is alert and oriented to person, place, and time.  Skin: Skin is warm and dry. Capillary refill takes less than 2 seconds.  Psychiatric: He has a normal mood and affect. His behavior is normal. Thought content normal.  Vitals reviewed.   Results for orders placed or performed during the hospital encounter of 04/24/18  Glucose, capillary  Result Value Ref Range   Glucose-Capillary 404 (H) 70 - 99 mg/dL   Comment 1 Notify RN    Comment 2 Document in Chart       Assessment & Plan:   Problem List Items Addressed This Visit      Endocrine   Uncontrolled diabetes mellitus with diabetic neuropathy (HCC) - Primary   Relevant Medications   aspirin 81 MG chewable tablet   Other Relevant Orders   POCT glycosylated hemoglobin (Hb A1C)    UncontrolledDM with  A1c 12.0% Worsening control from June and goal A1c < 7.0%. - Complications - peripheral neuropathy and hyperglycemia.  Plan:  1. Change therapy: Increase metformin to 1,000 mg bid.  Likely will need additional agent to get to goal A1c. - Unrealistic goals to get off medication. 2. Encourage improved lifestyle: - low carb/low glycemic diet reinforced prior education, handout provided again - Increase physical activity to 30 minutes most days of the week.  Explained that increased physical activity increases body's use of sugar for energy. 3. Check fasting am CBG and bring log to next visit for review 4. Continue ACEi and Statin 5. Advised to  schedule DM ophtho exam, send record.  Consider transfer care to [facility] for easier access to Ophthalmology exam 6. Follow-up 3 months    Meds ordered this encounter  Medications  . metFORMIN (GLUCOPHAGE) 1000 MG tablet    Sig: Take 1 tablet (1,000 mg total) by mouth 2 (two) times daily with a meal.    Dispense:  60 tablet    Refill:  5    Order Specific Question:   Supervising Provider    Answer:   Smitty Cords [[id number]]  . RELION LANCETS STANDARD 21G MISC    Sig: 1 Device by Does not apply route daily.    Dispense:  100 each    Refill:  3    Order Specific Question:   Supervising Provider    Answer:   Smitty Cords [[id number]]    Follow up plan: Return in about 3 months (around 11/01/2018) for diabetes.  A total of 30 minutes was spent face-to-face with this patient. Greater than 50% of this time (25/30 mins) was spent in counseling and coordination of care with the patient.    George Mcardle, DNP, AGPCNP-BC Adult Gerontology Primary Care Nurse Practitioner [facility] 08/01/2018, 1:31 PM

## 2018-10-11 ENCOUNTER — Other Ambulatory Visit: Payer: Self-pay | Admitting: Nurse Practitioner

## 2018-10-11 DIAGNOSIS — IMO0002 Reserved for concepts with insufficient information to code with codable children: Secondary | ICD-10-CM

## 2018-10-11 DIAGNOSIS — E1140 Type 2 diabetes mellitus with diabetic neuropathy, unspecified: Secondary | ICD-10-CM

## 2018-10-11 DIAGNOSIS — I10 Essential (primary) hypertension: Secondary | ICD-10-CM

## 2018-10-11 DIAGNOSIS — E1165 Type 2 diabetes mellitus with hyperglycemia: Secondary | ICD-10-CM

## 2018-10-11 MED ORDER — LISINOPRIL-HYDROCHLOROTHIAZIDE 20-12.5 MG PO TABS: 1.0000 | ORAL_TABLET | Freq: Every day | ORAL | 6 refills | Status: DC

## 2018-10-11 MED ORDER — ATORVASTATIN CALCIUM 40 MG PO TABS: 40.0000 mg | ORAL_TABLET | Freq: Every day | ORAL | 1 refills | Status: DC

## 2018-10-11 NOTE — Telephone Encounter (Signed)
Pt   Stop by  Requesting  Refill Lisinopril 20 mg ,  Atorvastatin 40 mg [facility]  [place]

## 2019-01-27 ENCOUNTER — Telehealth: Payer: Self-pay

## 2019-01-27 NOTE — Telephone Encounter (Signed)
Open in error

## 2019-04-07 ENCOUNTER — Other Ambulatory Visit: Payer: Self-pay | Admitting: Nurse Practitioner

## 2019-04-07 DIAGNOSIS — I10 Essential (primary) hypertension: Secondary | ICD-10-CM

## 2019-04-11 ENCOUNTER — Other Ambulatory Visit: Payer: Self-pay

## 2019-04-11 DIAGNOSIS — I10 Essential (primary) hypertension: Secondary | ICD-10-CM

## 2019-04-11 DIAGNOSIS — E1140 Type 2 diabetes mellitus with diabetic neuropathy, unspecified: Secondary | ICD-10-CM

## 2019-04-11 DIAGNOSIS — IMO0002 Reserved for concepts with insufficient information to code with codable children: Secondary | ICD-10-CM

## 2019-04-11 MED ORDER — LISINOPRIL-HYDROCHLOROTHIAZIDE 20-12.5 MG PO TABS: 1.0000 | ORAL_TABLET | Freq: Every day | ORAL | 0 refills | Status: DC

## 2019-04-20 ENCOUNTER — Ambulatory Visit (INDEPENDENT_AMBULATORY_CARE_PROVIDER_SITE_OTHER): Payer: Self-pay | Admitting: Nurse Practitioner

## 2019-04-20 ENCOUNTER — Other Ambulatory Visit: Payer: Self-pay

## 2019-04-20 ENCOUNTER — Encounter: Payer: Self-pay | Admitting: Nurse Practitioner

## 2019-04-20 DIAGNOSIS — IMO0002 Reserved for concepts with insufficient information to code with codable children: Secondary | ICD-10-CM

## 2019-04-20 DIAGNOSIS — I10 Essential (primary) hypertension: Secondary | ICD-10-CM

## 2019-04-20 DIAGNOSIS — E1140 Type 2 diabetes mellitus with diabetic neuropathy, unspecified: Secondary | ICD-10-CM

## 2019-04-20 DIAGNOSIS — E1165 Type 2 diabetes mellitus with hyperglycemia: Secondary | ICD-10-CM

## 2019-04-20 MED ORDER — METFORMIN HCL 1000 MG PO TABS: 1000.0000 mg | ORAL_TABLET | Freq: Two times a day (BID) | ORAL | 0 refills | Status: DC

## 2019-04-20 MED ORDER — CARVEDILOL 3.125 MG PO TABS: ORAL_TABLET | ORAL | 1 refills | Status: DC

## 2019-04-20 MED ORDER — SIMVASTATIN 40 MG PO TABS: 40.0000 mg | ORAL_TABLET | Freq: Every day | ORAL | 3 refills | Status: DC

## 2019-04-20 MED ORDER — LISINOPRIL-HYDROCHLOROTHIAZIDE 20-12.5 MG PO TABS: 1.0000 | ORAL_TABLET | Freq: Every day | ORAL | 1 refills | Status: DC

## 2019-04-20 NOTE — Progress Notes (Signed)
Telemedicine Encounter: Disclosed to patient at start of encounter that we will provide appropriate telemedicine services.  Patient consents to be treated via phone prior to discussion. - Patient is at his home and is accessed via telephone. - Services are provided by Wilhelmina McardleLauren Lewie Deman from [facility].  Subjective:    Patient ID: George Bookmanichard Ferrel Smith    Jr., male    DOB: [birth year], 53 y.o.   MRN: [id number]  George BoerRichard Ferrel De BurrsSmith    Jr. is a 53 y.o. male presenting on 04/20/2019 for Hypertension  HPI Hypertension - He is checking BP at home or outside of clinic.  [facility] - Current medications: carvedilol 3.125 mg bid, lisinopril-hctz 20-12.5 daily, tolerating well without side effects - He is not currently symptomatic. - Pt denies headache, lightheadedness, dizziness, changes in vision, chest tightness/pressure, palpitations, leg swelling, sudden loss of speech or loss of consciousness. - He  reports no regular exercise routine outside of work at [employer]. - His diet is moderate in salt, moderate in fat, and moderate in carbohydrates.   Diabetes Pt presents today for follow up of Type 2 diabetes mellitus. He is not checking CBG at home. When last checking 280-450 at last checks about 3 months ago.  His meter is out of batteries. - Current diabetic medications include: metformin - He is symptomatic with polyuria.  - He denies polydipsia, polyphagia, headaches, diaphoresis, shakiness, chills, pain, numbness or tingling in extremities and changes in vision.   - Clinical course has been unchanged. - Weight trend: increasing steadily  PREVENTION: Eye exam current (within one year): no - not in person to perform exam Foot exam current (within one year): no - not in person to perform exam Lipid/ASCVD risk reduction - on statin: not taking atorvastatin, cannot remember reason Kidney protection - on ace or arb: yes Recent Labs    08/01/18 1337  HGBA1C  12.0*    Social History   Tobacco Use  . Smoking status: Never Smoker  . Smokeless tobacco: Never Used  Substance Use Topics  . Alcohol use: No  . Drug use: No   Review of Systems Per HPI unless specifically indicated above    Objective:    There were no vitals taken for this visit.  Wt Readings from Last 3 Encounters:  08/01/18 285 lb 3.2 oz (129.4 kg)  05/24/18 288 lb 6.4 oz (130.8 kg)  04/24/18 294 lb (133.4 kg)    Physical Exam Patient remotely monitored.  Verbal communication appropriate.  Cognition normal.  Results for orders placed or performed in visit on 08/01/18  POCT glycosylated hemoglobin (Hb A1C)  Result Value Ref Range   Hemoglobin A1C 12.0 (A) 4.0 - 5.6 %   HbA1c POC (<> result, manual entry)     HbA1c, POC (prediabetic range)     HbA1c, POC (controlled diabetic range)        Assessment & Plan:   Problem List Items Addressed This Visit      Cardiovascular and Mediastinum   HTN (hypertension) Controlled hypertension per home readings.  BP goal < 130/80.  Pt is not working on lifestyle modifications.  Taking medications tolerating well without side effects. Complications unknown.  No recent labs.  Plan: 1. Continue taking medications without changes 2. Obtain labs CMP in next 1-2 months - need recheck of kidney function specifically  3. Encouraged heart healthy diet and increasing exercise to 30 minutes most days of the week. 4. Check BP 1-2 x per  week at home, keep log, and bring to clinic at next appointment. 5. Follow up 3 months.     Relevant Medications   carvedilol (COREG) 3.125 MG tablet   lisinopril-hydrochlorothiazide (ZESTORETIC) 20-12.5 MG tablet   simvastatin (ZOCOR) 40 MG tablet   Other Relevant Orders   Referral to Chronic Care Management Services     Endocrine   Uncontrolled diabetes mellitus with diabetic neuropathy ([place]) - Primary Status unknown.  Recheck labs.  Continue meds without changes today.  Refills provided. Followup 3  months and as needed after labs.   Relevant Medications   lisinopril-hydrochlorothiazide (ZESTORETIC) 20-12.5 MG tablet   metFORMIN (GLUCOPHAGE) 1000 MG tablet   simvastatin (ZOCOR) 40 MG tablet   Other Relevant Orders   POCT glycosylated hemoglobin (Hb A1C)   Referral to Chronic Care Management Services      Meds ordered this encounter  Medications  . carvedilol (COREG) 3.125 MG tablet    Sig: TAKE 1 TABLET BY MOUTH TWICE DAILY WITH A MEAL    Dispense:  180 tablet    Refill:  1    Order Specific Question:   Supervising Provider    Answer:   Olin Hauser [[id number]]  . lisinopril-hydrochlorothiazide (ZESTORETIC) 20-12.5 MG tablet    Sig: Take 1 tablet by mouth daily.    Dispense:  90 tablet    Refill:  1    Order Specific Question:   Supervising Provider    Answer:   Olin Hauser [[id number]]  . metFORMIN (GLUCOPHAGE) 1000 MG tablet    Sig: Take 1 tablet (1,000 mg total) by mouth 2 (two) times daily with a meal.    Dispense:  180 tablet    Refill:  0    Order Specific Question:   Supervising Provider    Answer:   Olin Hauser [[id number]]  . simvastatin (ZOCOR) 40 MG tablet    Sig: Take 1 tablet (40 mg total) by mouth at bedtime.    Dispense:  90 tablet    Refill:  3    Order Specific Question:   Supervising Provider    Answer:   Olin Hauser [[id number]]   - Time spent in direct consultation with patient via telemedicine about above concerns: 12 minutes  Follow up plan: Return in about 3 months (around 07/21/2019) for diabetes, hypertension.  Cassell Smiles, DNP, AGPCNP-BC Adult Gerontology Primary Care Nurse Practitioner [facility] 04/20/2019, 10:11 AM

## 2019-04-28 ENCOUNTER — Ambulatory Visit: Payer: Self-pay | Admitting: Pharmacist

## 2019-04-28 DIAGNOSIS — E1140 Type 2 diabetes mellitus with diabetic neuropathy, unspecified: Secondary | ICD-10-CM

## 2019-04-28 DIAGNOSIS — IMO0002 Reserved for concepts with insufficient information to code with codable children: Secondary | ICD-10-CM

## 2019-04-28 NOTE — Chronic Care Management (AMB) (Signed)
  Care Management   Note  04/28/2019 Name: George Colon. MRN: [id number] DOB: Dec 12, 1965  Reviewed referral. Patient is uninsured and in need of assistance with cost of medications.   Referral placed to [facility] Care Guide to assist patient with application for [facility]ication Management [facility].  Plan  Appointments with Care Management Nurse Case Manager and Social Worker scheduled Care Management Pharmacist will reach out to the patient to follow up over the next 30 days.   Harlow Asa, PharmD, [place] [employer] [phone]

## 2019-05-04 ENCOUNTER — Encounter: Payer: Self-pay | Admitting: Nurse Practitioner

## 2019-05-04 ENCOUNTER — Telehealth: Payer: Self-pay | Admitting: Pharmacy Technician

## 2019-05-04 ENCOUNTER — Telehealth: Payer: Self-pay | Admitting: Nurse Practitioner

## 2019-05-04 NOTE — Telephone Encounter (Signed)
° ° °  Called pt regarding [employer] Referral for Rx assistance, spoke with pt about applying for the [facility]ication Management Clinic he stated that he didn't feel like the cost of his medications were out of his control at this point and he could afford them.  He said that he may be going on a more expensive cholesterol drug at some point but for now his expenses through his [place] [facility] in [place] are reasonable. I asked if I could send him the application along with my contact info so he could have it in case he needed it/changed his mind in the future and he asked that I mail that to his home address and make sure and indicate [address] [place]. Since he has a family member with same name living there.  Will complete letter and application and put in mail for patient.  [place]  ??[email or web address]  ?? [phone]   Skype

## 2019-05-04 NOTE — Telephone Encounter (Signed)
Provided patient with new patient packet to obtain Medication Management Clinic services.  Betty J. Kluttz Care Manager Medication Management Clinic  

## 2019-05-04 NOTE — Telephone Encounter (Signed)
Spoke with patient regarding the completion of [facility]'s application.  Patient stated that he was driving and unable to talk.  Patient stated that he would contact our clinic if he needed assistance.  [facility]

## 2019-05-15 ENCOUNTER — Ambulatory Visit: Payer: Self-pay | Admitting: *Deleted

## 2019-05-15 ENCOUNTER — Telehealth: Payer: Self-pay

## 2019-05-15 DIAGNOSIS — E1140 Type 2 diabetes mellitus with diabetic neuropathy, unspecified: Secondary | ICD-10-CM

## 2019-05-15 DIAGNOSIS — IMO0002 Reserved for concepts with insufficient information to code with codable children: Secondary | ICD-10-CM

## 2019-05-15 NOTE — Chronic Care Management (AMB) (Signed)
  Chronic Care Management   Outreach Note  05/15/2019 Name: Jamarques Pinedo. MRN: [id number] DOB: March 30, 1966  Referred by: Mikey College, NP Reason for referral : Chronic Care Management (Unsuccessful Outreach attempt x1)   An unsuccessful telephone outreach was attempted today. The patient was referred to the case management team by PCP for assistance with chronic care management and care coordination.   Follow Up Plan: The care management team will reach out to the patient again over the next 30 days. Unable to leave a VM related to the VM not being set up.  Merlene Morse Lucrecia Mcphearson RN, BSN Nurse Case Pharmacist, [facility] Care [facility]  [phone]) Business Mobile

## 2019-05-18 ENCOUNTER — Telehealth: Payer: Self-pay

## 2019-05-18 ENCOUNTER — Ambulatory Visit: Payer: Self-pay | Admitting: Licensed Clinical Social Worker

## 2019-05-18 NOTE — Chronic Care Management (AMB) (Signed)
  Care Management   Follow Up Note   05/18/2019 Name: George Colon. MRN: [id number] DOB: 01/22/1966  Referred by: Mikey College, NP Reason for referral : Care Coordination   [name]. is a 53 y.o. year old male who is a primary care patient of Mikey College, NP. The care management team was consulted for assistance with care management and care coordination needs.    Review of patient status, including review of consultants reports, relevant laboratory and other test results, and collaboration with appropriate care team members and the patient's provider was performed as part of comprehensive patient evaluation and provision of chronic care management services.    LCSW completed CCM outreach attempt today but was unable to reach patient successfully. A HIPPA compliant voice message was left encouraging patient to return call once available. LCSW rescheduled CCM SW appointment as well.  A HIPPA compliant phone message was left for the patient providing contact information and requesting a return call.   Eula Fried, BSW, MSW, [email or web address] Phone: [phone]

## 2019-05-24 ENCOUNTER — Telehealth: Payer: Self-pay

## 2019-05-25 ENCOUNTER — Ambulatory Visit: Payer: Self-pay | Admitting: Pharmacist

## 2019-05-25 NOTE — Chronic Care Management (AMB) (Signed)
  Care Management   Initial Visit Note  05/25/2019 Name: George Colon. MRN: [id number] DOB: [birth year]  Subjective:   Objective:  Assessment: George Colon. is a 53 y.o. year old male who sees George College, NP for primary care. The care management team was consulted for assistance with care management and care coordination needs related to Disease Management Medication Assistance .   Per chart, George Colon was contacted by Medication Management Clinic [facility]) on 8/6 and provided with an application for [facility] assistance.  Outreach call to George Colon today. Patient declines CM services. States that he has all of his medications, has glucose monitoring supplies and has received the [facility] application in the mail to complete if he decides to.   States that he has received the voicemail from [place] and CM Social Worker, but does not wish to engage with our services at this time, as he does not like to give out information over the phone and denies need for our services   Follow up plan:  Will follow up with PCP, CM Nurse Case Manager and CM Social Worker regarding George Colon declining Care Management services The care management team is available to follow up with the patient after provider conversation with the patient regarding recommendation for care management engagement and subsequent re-referral to the care management team.    Harlow Asa, PharmD, [facility] [phone]

## 2019-06-12 ENCOUNTER — Telehealth: Payer: Self-pay

## 2019-06-15 ENCOUNTER — Telehealth: Payer: Self-pay

## 2020-03-26 ENCOUNTER — Other Ambulatory Visit: Payer: Self-pay

## 2020-03-26 ENCOUNTER — Ambulatory Visit (INDEPENDENT_AMBULATORY_CARE_PROVIDER_SITE_OTHER): Payer: Self-pay | Admitting: Family Medicine

## 2020-03-26 ENCOUNTER — Encounter: Payer: Self-pay | Admitting: Family Medicine

## 2020-03-26 VITALS — BP 142/95 | HR 68 | Temp 98.2°F | Ht 72.0 in | Wt 279.0 lb

## 2020-03-26 DIAGNOSIS — E1165 Type 2 diabetes mellitus with hyperglycemia: Secondary | ICD-10-CM

## 2020-03-26 DIAGNOSIS — R635 Abnormal weight gain: Secondary | ICD-10-CM

## 2020-03-26 DIAGNOSIS — Z79899 Other long term (current) drug therapy: Secondary | ICD-10-CM

## 2020-03-26 DIAGNOSIS — E1140 Type 2 diabetes mellitus with diabetic neuropathy, unspecified: Secondary | ICD-10-CM

## 2020-03-26 DIAGNOSIS — IMO0002 Reserved for concepts with insufficient information to code with codable children: Secondary | ICD-10-CM

## 2020-03-26 DIAGNOSIS — I4891 Unspecified atrial fibrillation: Secondary | ICD-10-CM

## 2020-03-26 DIAGNOSIS — I10 Essential (primary) hypertension: Secondary | ICD-10-CM

## 2020-03-26 LAB — POCT URINALYSIS DIPSTICK
Bilirubin, UA: NEGATIVE
Blood, UA: NEGATIVE
Glucose, UA: POSITIVE — AB
Ketones, UA: NEGATIVE
Leukocytes, UA: NEGATIVE
Nitrite, UA: NEGATIVE
Protein, UA: NEGATIVE
Spec Grav, UA: 1.015 (ref 1.010–1.025)
Urobilinogen, UA: 0.2 E.U./dL
pH, UA: 5 (ref 5.0–8.0)

## 2020-03-26 LAB — POCT UA - MICROALBUMIN: Microalbumin Ur, POC: NEGATIVE mg/L

## 2020-03-26 LAB — POCT GLYCOSYLATED HEMOGLOBIN (HGB A1C): Hemoglobin A1C: 14 % — AB (ref 4.0–5.6)

## 2020-03-26 MED ORDER — METFORMIN HCL 500 MG PO TABS: ORAL_TABLET | ORAL | 0 refills | Status: DC

## 2020-03-26 MED ORDER — LISINOPRIL 20 MG PO TABS: 20.0000 mg | ORAL_TABLET | Freq: Every day | ORAL | 0 refills | Status: DC

## 2020-03-26 MED ORDER — SIMVASTATIN 20 MG PO TABS: 20.0000 mg | ORAL_TABLET | Freq: Every day | ORAL | 3 refills | Status: DC

## 2020-03-26 MED ORDER — LISINOPRIL-HYDROCHLOROTHIAZIDE 20-12.5 MG PO TABS: 1.0000 | ORAL_TABLET | Freq: Every day | ORAL | 0 refills | Status: DC

## 2020-03-26 NOTE — Progress Notes (Signed)
Subjective:    Patient ID: George Colon., male    DOB: [birth year], 54 y.o.   MRN: [id number]  George Colon. is a 54 y.o. male presenting on 03/26/2020 for Diabetes (med RF, foot exam, A1C, microal., UA, no longer taking metformin) and Hypertension   HPI  Patient presents to clinic for follow up on his diabetes and hypertension follow up visit.  Reports he has not been taking medications for her diabetes or checking his CBGs.  Reports he does not want to take any medications long term, and does not want to begin any medications that he may need to take for the rest of his life.  Diabetes Pt presents today for follow up Type 2 Diabetes Mellitus.  He/she (caps): He ACTION; IS/IS NOT: is not checking AM CBG at home. -Current diabetic medications include: None -ACTION; IS/IS NOT: is not currently symptomatic -Actions; denies/reports/admits to: denies polydipsia, polyphagia, polyuria, headaches, diaphoresis, shakiness, chills, pain, numbness or tingling in extremities or changes in vision -Clinical course has been declining -Reports no structured exercise routine -Diet is high in salt, high in fat, and high in carbohydrates  PREVENTION Eye exam current (within 1 year): Does not go, declines  Foot exam current (within 1 year) Completed today Lipid/ASCVD risk reduction - on statin: YES/NO: Started on simvastatin today Kidney Protection (On ACE/ARB)? YES/NO: Yes   Hypertension - He is not checking BP at home or outside of clinic.    - Current medications: lisinopril-hydrochlorothiazide 20-12.5mg  daily, tolerating well without side effects - He is not currently symptomatic. - Pt denies headache, lightheadedness, dizziness, changes in vision, chest tightness/pressure, palpitations, leg swelling, sudden loss of speech or loss of consciousness. - He  reports no regular exercise routine. - His diet is high in salt, high in fat, and high in  carbohydrates.     Depression screen [facility] 2/9 03/26/2020 04/20/2019 03/04/2018  Decreased Interest 0 0 0  Down, Depressed, Hopeless 0 0 0  PHQ - 2 Score 0 0 0  Altered sleeping 1 - 0  Tired, decreased energy 0 - 0  Change in appetite 0 - 0  Feeling bad or failure about yourself  0 - 0  Trouble concentrating 0 - 0  Moving slowly or fidgety/restless 0 - 0  Suicidal thoughts 0 - 0  PHQ-9 Score 1 - 0  Difficult doing work/chores Not difficult at all - Not difficult at all    Social History   Tobacco Use  . Smoking status: Never Smoker  . Smokeless tobacco: Never Used  Substance Use Topics  . Alcohol use: No  . Drug use: No    Review of Systems  Constitutional: Negative.   HENT: Negative.   Eyes: Negative.   Respiratory: Negative.   Cardiovascular: Negative.   Gastrointestinal: Negative.   Endocrine: Negative.   Genitourinary: Negative.   Musculoskeletal: Negative.   Skin: Negative.   Allergic/Immunologic: Negative.   Neurological: Negative.   Hematological: Negative.   Psychiatric/Behavioral: Negative.    Per HPI unless specifically indicated above     Objective:    BP (!) 142/95   Pulse 68   Temp 98.2 F (36.8 C) (Oral)   Ht 6' (1.829 m)   Wt 279 lb (126.6 kg)   SpO2 99%   BMI 37.84 kg/m   Wt Readings from Last 3 Encounters:  03/26/20 279 lb (126.6 kg)  08/01/18 285 lb 3.2 oz (129.4 kg)  05/24/18 288 lb  6.4 oz (130.8 kg)    Physical Exam Vitals reviewed.  Constitutional:      General: He is not in acute distress.    Appearance: Normal appearance. He is well-developed and well-groomed. He is obese. He is not ill-appearing or toxic-appearing.  HENT:     Head: Normocephalic and atraumatic.     Nose:     Comments: Lesia Sago is in place, covering mouth and nose. Eyes:     General:        Right eye: No discharge.        Left eye: No discharge.     Extraocular Movements: Extraocular movements intact.     Conjunctiva/sclera: Conjunctivae normal.      Pupils: Pupils are equal, round, and reactive to light.  Cardiovascular:     Rate and Rhythm: Normal rate and regular rhythm.     Pulses: Normal pulses.          Dorsalis pedis pulses are 2+ on the right side and 2+ on the left side.     Heart sounds: Normal heart sounds. No murmur heard.  No friction rub. No gallop.   Pulmonary:     Effort: Pulmonary effort is normal. No respiratory distress.     Breath sounds: Normal breath sounds.  Musculoskeletal:     Right lower leg: No edema.     Left lower leg: No edema.  Feet:     Right foot:     Toenail Condition: Fungal disease present.    Left foot:     Toenail Condition: Fungal disease present.    Comments: Some callus and absent all other lesions.  Some dry skin noted, but pt reports regular foot care and can see bottom of his feet.  Skin:    General: Skin is warm and dry.     Capillary Refill: Capillary refill takes less than 2 seconds.  Neurological:     General: No focal deficit present.     Mental Status: He is alert and oriented to person, place, and time.  Psychiatric:        Attention and Perception: Attention and perception normal.        Mood and Affect: Mood and affect normal.        Speech: Speech normal.        Behavior: Behavior normal. Behavior is cooperative.        Thought Content: Thought content normal.        Cognition and Memory: Cognition and memory normal.     Results for orders placed or performed in visit on 03/26/20  POCT Urinalysis Dipstick  Result Value Ref Range   Color, UA auburn    Clarity, UA clear    Glucose, UA Positive (A) Negative   Bilirubin, UA neg    Ketones, UA neg    Spec Grav, UA 1.015 1.010 - 1.025   Blood, UA neg    pH, UA 5.0 5.0 - 8.0   Protein, UA Negative Negative   Urobilinogen, UA 0.2 0.2 or 1.0 E.U./dL   Nitrite, UA neg    Leukocytes, UA Negative Negative   Appearance auburn,clear    Odor none   POCT HgB A1C  Result Value Ref Range   Hemoglobin A1C 14.0 (A) 4.0 - 5.6  %  POCT UA - Microalbumin  Result Value Ref Range   Microalbumin Ur, POC neg mg/L      Assessment & Plan:   Problem List Items Addressed This Visit  Cardiovascular and Mediastinum   HTN (hypertension)    Uncontrolled hypertension.  BP is not at goal < 130/80.  Pt is not working on lifestyle modifications.  Taking medications, when remembers, tolerating well without side effects.  Reports has not taken his blood pressure medication today.  Complications: obesity, uncontrolled diabetes, non-medicated atrial fibrillation.  Patient requesting to reduce his lisinopril-HCTZ 20-12.5mg  to lisinopril 20mg  only.  Reports his sporadic blood pressure measurements have been <130/80.  In agreement and will follow up in 2 weeks for re-evaluation.  Plan: 1. Can change to lisinopril 20mg  daily 2. Obtain labs today  3. Encouraged heart healthy diet and increasing exercise to 30 minutes most days of the week, going no more than 2 days in a row without exercise. 4. Check BP 1-2 x per week at home, keep log, and bring to clinic at next appointment. 5. Follow up 2 weeks.         Relevant Medications   lisinopril (ZESTRIL) 20 MG tablet   simvastatin (ZOCOR) 20 MG tablet   Other Relevant Orders   POCT Urinalysis Dipstick (Completed)   CBC with Differential   COMPLETE METABOLIC PANEL WITH GFR   Atrial fibrillation (HCC)    No medication management at this time.  Patient has previously been on carvedilol 3.125mg  BID but reports has stopped taking this medication.  Offered and declined referral to cardiology for evaluation and input on treatment plan, declines.  Discussed importance of taking medications as prescribed, as atrial fibrillation can lead to clots within the ventricle and those clots can travel to cause heart attack and stroke, up to and including death.  Patient verbalized understanding and decline medications.  Plan: 1. Offered and declined cardiology referral      Relevant Medications    lisinopril (ZESTRIL) 20 MG tablet   simvastatin (ZOCOR) 20 MG tablet     Endocrine   Uncontrolled diabetes mellitus with diabetic neuropathy (HCC)    UncontrolledDM with A1c 14.0% Worsening control from 12.0% on 07/2018 and goal A1c < 7.0%. - Complications - peripheral neuropathy and hyperglycemia. Uncontrolled hypertension, uncontrolled atrial fibrillation, worsening vision.  Discussed needing referral to Endocrinology based on A1C and being unable to control diabetes.  Patient declines referral.  Plan:  1. BEGIN Metformin and titrate up dosage to 1000mg  twice daily 2. Encourage improved lifestyle: - low carb/low glycemic diet reinforced prior education - Increase physical activity to 30 minutes most days of the week.  Explained that increased physical activity increases body's use of sugar for energy. 3. Check fasting am CBG and log these.  Bring log to next visit for review 4. Continue ASA, ACEi and Statin 5. DM Foot exam done today with acute findings of neuropathy and advised to schedule DM ophtho exam, send record, patient declined eye exam 6. Follow-up 3 months       Relevant Medications   lisinopril (ZESTRIL) 20 MG tablet   metFORMIN (GLUCOPHAGE) 500 MG tablet   simvastatin (ZOCOR) 20 MG tablet   Other Relevant Orders   POCT Urinalysis Dipstick (Completed)   POCT HgB A1C (Completed)   POCT UA - Microalbumin (Completed)   CBC with Differential   COMPLETE METABOLIC PANEL WITH GFR   Lipid Profile    Other Visit Diagnoses    Weight gain    -  Primary   Relevant Orders   Thyroid Panel With TSH   Long-term use of high-risk medication       Relevant Orders   Lipid Profile  Thyroid Panel With TSH      Meds ordered this encounter  Medications  . DISCONTD: lisinopril-hydrochlorothiazide (ZESTORETIC) 20-12.5 MG tablet    Sig: Take 1 tablet by mouth daily.    Dispense:  90 tablet    Refill:  0  . lisinopril (ZESTRIL) 20 MG tablet    Sig: Take 1 tablet (20 mg total) by  mouth daily.    Dispense:  90 tablet    Refill:  0  . metFORMIN (GLUCOPHAGE) 500 MG tablet    Sig: Take 1 tablet (500 mg total) by mouth daily with breakfast for 7 days, THEN 1 tablet (500 mg total) 2 (two) times daily with a meal for 7 days, THEN 2 tablets (1,000 mg total) 2 (two) times daily with a meal.    Dispense:  381 tablet    Refill:  0  . simvastatin (ZOCOR) 20 MG tablet    Sig: Take 1 tablet (20 mg total) by mouth at bedtime.    Dispense:  90 tablet    Refill:  3      Follow up plan: Return in about 2 weeks (around 04/09/2020) for HTN F/U.   Charlaine Dalton, FNP Family Nurse Practitioner [facility] 03/26/2020, 12:29 PM

## 2020-03-26 NOTE — Assessment & Plan Note (Signed)
Uncontrolled hypertension.  BP is not at goal < 130/80.  Pt is not working on lifestyle modifications.  Taking medications, when remembers, tolerating well without side effects.  Reports has not taken his blood pressure medication today.  Complications: obesity, uncontrolled diabetes, non-medicated atrial fibrillation.  Patient requesting to reduce his lisinopril-HCTZ 20-12.5mg  to lisinopril 20mg  only.  Reports his sporadic blood pressure measurements have been <130/80.  In agreement and will follow up in 2 weeks for re-evaluation.  Plan: 1. Can change to lisinopril 20mg  daily 2. Obtain labs today  3. Encouraged heart healthy diet and increasing exercise to 30 minutes most days of the week, going no more than 2 days in a row without exercise. 4. Check BP 1-2 x per week at home, keep log, and bring to clinic at next appointment. 5. Follow up 2 weeks.

## 2020-03-26 NOTE — Assessment & Plan Note (Signed)
No medication management at this time.  Patient has previously been on carvedilol 3.125mg  BID but reports has stopped taking this medication.  Offered and declined referral to cardiology for evaluation and input on treatment plan, declines.  Discussed importance of taking medications as prescribed, as atrial fibrillation can lead to clots within the ventricle and those clots can travel to cause heart attack and stroke, up to and including death.  Patient verbalized understanding and decline medications.  Plan: 1. Offered and declined cardiology referral

## 2020-03-26 NOTE — Patient Instructions (Signed)
I have sent in a prescription for Metformin to take 1 tablet daily x 7 days, then increase to 1 tablet 2x daily then increase to 2 tablets 2x daily.  We will plan to repeat your A1C in 3 months.  I have sent in a prescription for Atorvastatin to take 1 tablet daily to help reduce your risk of atherosclerotic cardiovascular disease.  Think about if you'd like the referral to podiatry and we can discuss at your next visit.  It is important that we get your glucose under control to prevent any worsening of your diabetes and hypertension.  When we meet again in 3 months, we can decide if we need to refer you to endocrinology for management of your diabetes.  Advice on Protecting Your Feet   1. Daily foot inspections - Look for any breaks in the skin or areas of irritation such as blisters or red areas. Report to primary doctor or foot nurse immediately if any problems occur.  - If you have vision problems and cannot see your feet well or it is hard for you to reach your feet, ask a family member to inspect your feet daily.   2. Daily foot hygiene  - Wash feet daily, but do not soak your feet in hot water. If you do have callus, you may use warm water epsom salt foot soak and use moisturizer after. - Dry well especially between toes; Pat dry do not rub.  - If your skin is dry use a lotion to moisturize but never between the toes.  - If your skin is wet from perspiration, use an antifungal foot powder daily.   3. Shoes and Socks  - Wear a clean pair of socks daily.  - Make sure your shoes fit well and that you are measured and properly fit each time you purchase shoes. Your shoe size may change.  - Powder your shoes with a small amount of an antifungal foot powder daily, as the shoe is the only article of clothing that is not laundered.  - Wear appropriate shoes and socks for the weather. It is especially important to protect your feet from the cold; however, don't forget about sunscreen to the  tops of your feet in the hot sun.  - Wear well fitting shoes rather than slippers or flip-flops when walking or standing for long period of time.  - When at home make sure you always have protective foot wear on your feet.   Eat at least 3 meals and 1-2 snacks per day (don't skip breakfast).  Aim for no more than 5 hours between eating. - Tip: If you go >5 hours without eating and become very hungry, your body will supply it's own resources temporarily and you can gain extra weight when you eat.  The 5 Minute Rule of Exercise - Promise yourself to at least do 5 minutes of exercise (make sure you time it), and if at the end of 5 minutes (this is the hardest part of the work-out), if you still feel like you want stop (or not motivated to continue) then allow yourself to stop. Otherwise, more often than not you will feel encouraged that you can continue for a little while longer or even more!  My 5 to Fitness!  5: fruits and vegetables per day (work on 9 per day if you are at 5) 4: exercise 4-5 times per week for at least 30 minutes (walking counts!) 3: meals per day (don't skip breakfast!), no more  than 5 hours between meals 2: habits to quit -smoking -excess alcohol use (men >2 beer/day; women >1beer/day) 1: sweet per day (2 cookies, 1 small cup of ice cream, 12 oz soda)  These are general tips for healthy living. Try to start with 1 or 2 habit TODAY and make it a part of your life for several months. You set a goal today to work on:  Once you have 1 or 2 habits down for several months, try to begin working on your next healthy habit. With every single step you take, you will be leading a healthier lifestyle!   Diet Recommendations for Diabetes   Starchy (carb) foods include: Bread, rice, pasta, potatoes, corn, crackers, bagels, muffins, all baked goods.   Protein foods include: Meat, fish, poultry, eggs, dairy foods, and beans such as pinto and kidney beans (beans also provide  carbohydrate).   1. Eat at least 3 meals and 1-2 snacks per day. Never go more than 4-5 hours while awake without eating.   2. Limit starchy foods to TWO per meal and ONE per snack. ONE portion of a starchy  food is equal to the following:   - ONE slice of bread (or its equivalent, such as half of a hamburger bun).   - 1/2 cup of a "scoopable" starchy food such as potatoes or rice.   - 1 OUNCE (28 grams) of starchy snacks (crackers or pretzels, look on label).   - 15 grams of carbohydrate as shown on food label.   3. Both lunch and dinner should include a protein food, a carb food, and vegetables.   - Obtain twice as many veg's as protein or carbohydrate foods for both lunch and dinner.   - Try to keep frozen veg's on hand for a quick vegetable serving.     - Fresh or frozen veg's are best.   4. Breakfast should always include protein.    You can learn more information online about your diabetes at American Diabetes Association: http://www.diabetes.org/ - General self-care (diet, medications, blood sugar checks). - Diet recommendations - There are even recipes available for you to look at and try.  Try to get exercise a minimum of 30 minutes per day at least 5 days per week as well as  adequate water intake all while measuring blood pressure a few times per week.  Keep a blood pressure log and bring back to clinic at your next visit.  If your readings are consistently over 130/80 to contact our office/send me a MyChart message and we will see you sooner.  Can try DASH and Mediterranean diet options, avoiding processed foods, lowering sodium intake, avoiding pork products, and eating a plant based diet for optimal health.  Education and discussion with patient regarding hypertension as well as the effects on the organs and body.  Specifically, we spoke about kidney disease, kidney failure, heart attack, stroke and up to and including death, as likely outcomes if non-compliant with blood  pressure regulation.  Discussed how all of these habits are attached to each other and each has the effect on each other.  We will plan to see you back in 2 weeks for hypertension follow up visit and 3 months for diabetes follow up visit  You will receive a survey after today's visit either digitally by e-mail or paper by USPS mail. Your experiences and feedback matter to [name].  Please respond so we know how we are doing as we provide care for you.  Call  us with any questions/concerns/needs.  It is my goal to be available to you for your health concerns.  Thanks for choosing me to be a partner in your healthcare needs!  Charlaine Dalton, FNP-C Family Nurse Practitioner [facility] Phone: [phone]

## 2020-03-26 NOTE — Assessment & Plan Note (Addendum)
UncontrolledDM with A1c 14.0% Worsening control from 12.0% on 07/2018 and goal A1c < 7.0%. - Complications - peripheral neuropathy and hyperglycemia. Uncontrolled hypertension, uncontrolled atrial fibrillation, worsening vision.  Discussed needing referral to Endocrinology based on A1C and being unable to control diabetes.  Patient declines referral.  Plan:  1. BEGIN Metformin and titrate up dosage to 1000mg  twice daily 2. Encourage improved lifestyle: - low carb/low glycemic diet reinforced prior education - Increase physical activity to 30 minutes most days of the week.  Explained that increased physical activity increases body's use of sugar for energy. 3. Check fasting am CBG and log these.  Bring log to next visit for review 4. Continue ASA, ACEi and Statin 5. DM Foot exam done today with acute findings of neuropathy and advised to schedule DM ophtho exam, send record, patient declined eye exam 6. Follow-up 3 months

## 2020-03-27 ENCOUNTER — Other Ambulatory Visit: Payer: Self-pay | Admitting: Family Medicine

## 2020-03-27 ENCOUNTER — Other Ambulatory Visit: Payer: Self-pay

## 2020-03-27 DIAGNOSIS — IMO0002 Reserved for concepts with insufficient information to code with codable children: Secondary | ICD-10-CM

## 2020-03-27 DIAGNOSIS — E1140 Type 2 diabetes mellitus with diabetic neuropathy, unspecified: Secondary | ICD-10-CM

## 2020-03-27 DIAGNOSIS — I10 Essential (primary) hypertension: Secondary | ICD-10-CM

## 2020-03-27 DIAGNOSIS — Z79899 Other long term (current) drug therapy: Secondary | ICD-10-CM

## 2020-03-27 DIAGNOSIS — R635 Abnormal weight gain: Secondary | ICD-10-CM

## 2020-03-28 ENCOUNTER — Other Ambulatory Visit: Payer: Self-pay | Admitting: Family Medicine

## 2020-03-28 DIAGNOSIS — D696 Thrombocytopenia, unspecified: Secondary | ICD-10-CM

## 2020-03-28 LAB — COMPLETE METABOLIC PANEL WITH GFR
AG Ratio: 1.5 (calc) (ref 1.0–2.5)
ALT: 37 U/L (ref 9–46)
AST: 27 U/L (ref 10–35)
Albumin: 4 g/dL (ref 3.6–5.1)
Alkaline phosphatase (APISO): 61 U/L (ref 35–144)
BUN/Creatinine Ratio: 14 (calc) (ref 6–22)
BUN: 9 mg/dL (ref 7–25)
CO2: 27 mmol/L (ref 20–32)
Calcium: 9.1 mg/dL (ref 8.6–10.3)
Chloride: 101 mmol/L (ref 98–110)
Creat: 0.64 mg/dL — ABNORMAL LOW (ref 0.70–1.33)
GFR, Est African American: 130 mL/min/{1.73_m2} (ref >=60)
GFR, Est Non African American: 112 mL/min/{1.73_m2} (ref >=60)
Globulin: 2.6 g/dL (calc) (ref 1.9–3.7)
Glucose, Bld: 314 mg/dL — ABNORMAL HIGH (ref 65–99)
Potassium: 3.9 mmol/L (ref 3.5–5.3)
Sodium: 136 mmol/L (ref 135–146)
Total Bilirubin: 0.6 mg/dL (ref 0.2–1.2)
Total Protein: 6.6 g/dL (ref 6.1–8.1)

## 2020-03-28 LAB — CBC WITH DIFFERENTIAL/PLATELET
Absolute Monocytes: 443 cells/uL (ref 200–950)
Basophils Absolute: 30 cells/uL (ref 0–200)
Basophils Relative: 0.5 %
Eosinophils Absolute: 30 cells/uL (ref 15–500)
Eosinophils Relative: 0.5 %
HCT: 46.1 % (ref 38.5–50.0)
Hemoglobin: 15.2 g/dL (ref 13.2–17.1)
Lymphs Abs: 1263 cells/uL (ref 850–3900)
MCH: 30.5 pg (ref 27.0–33.0)
MCHC: 33 g/dL (ref 32.0–36.0)
MCV: 92.4 fL (ref 80.0–100.0)
MPV: 12.3 fL (ref 7.5–12.5)
Monocytes Relative: 7.5 %
Neutro Abs: 4136 cells/uL (ref 1500–7800)
Neutrophils Relative %: 70.1 %
Platelets: 88 10*3/uL — ABNORMAL LOW (ref 140–400)
RBC: 4.99 10*6/uL (ref 4.20–5.80)
RDW: 13.1 % (ref 11.0–15.0)
Total Lymphocyte: 21.4 %
WBC: 5.9 10*3/uL (ref 3.8–10.8)

## 2020-03-28 LAB — THYROID PANEL WITH TSH
Free Thyroxine Index: 2.3 (ref 1.4–3.8)
T3 Uptake: 31 % (ref 22–35)
T4, Total: 7.3 ug/dL (ref 4.9–10.5)
TSH: 0.97 mIU/L (ref 0.40–4.50)

## 2020-03-28 LAB — LIPID PANEL
Cholesterol: 168 mg/dL (ref <200)
HDL: 34 mg/dL — ABNORMAL LOW (ref >=40)
LDL Cholesterol (Calc): 113 mg/dL (calc) — ABNORMAL HIGH
Non-HDL Cholesterol (Calc): 134 mg/dL (calc) — ABNORMAL HIGH (ref <130)
Total CHOL/HDL Ratio: 4.9 (calc) (ref <5.0)
Triglycerides: 100 mg/dL (ref <150)

## 2020-04-09 ENCOUNTER — Ambulatory Visit: Payer: Self-pay | Admitting: Family Medicine

## 2020-05-24 ENCOUNTER — Encounter: Payer: Self-pay | Admitting: Family Medicine

## 2020-05-24 ENCOUNTER — Other Ambulatory Visit: Payer: Self-pay

## 2020-05-24 ENCOUNTER — Ambulatory Visit (INDEPENDENT_AMBULATORY_CARE_PROVIDER_SITE_OTHER): Payer: Self-pay | Admitting: Family Medicine

## 2020-05-24 VITALS — BP 123/85 | HR 69 | Temp 98.4°F | Resp 17 | Ht 72.0 in | Wt 262.0 lb

## 2020-05-24 DIAGNOSIS — I10 Essential (primary) hypertension: Secondary | ICD-10-CM

## 2020-05-24 DIAGNOSIS — I4891 Unspecified atrial fibrillation: Secondary | ICD-10-CM

## 2020-05-24 DIAGNOSIS — E1140 Type 2 diabetes mellitus with diabetic neuropathy, unspecified: Secondary | ICD-10-CM

## 2020-05-24 DIAGNOSIS — N529 Male erectile dysfunction, unspecified: Secondary | ICD-10-CM

## 2020-05-24 DIAGNOSIS — D696 Thrombocytopenia, unspecified: Secondary | ICD-10-CM

## 2020-05-24 DIAGNOSIS — IMO0002 Reserved for concepts with insufficient information to code with codable children: Secondary | ICD-10-CM

## 2020-05-24 DIAGNOSIS — E1165 Type 2 diabetes mellitus with hyperglycemia: Secondary | ICD-10-CM

## 2020-05-24 MED ORDER — LISINOPRIL 10 MG PO TABS: 10.0000 mg | ORAL_TABLET | Freq: Every day | ORAL | 3 refills | Status: DC

## 2020-05-24 MED ORDER — TADALAFIL 10 MG PO TABS: 10.0000 mg | ORAL_TABLET | Freq: Every day | ORAL | 1 refills | Status: DC | PRN

## 2020-05-24 NOTE — Progress Notes (Signed)
Subjective:    Patient ID: George Bookmanichard Ferrel Smith    Jr., male    DOB: October 13, 1965, 54 y.o.   MRN: [id number]  George BoerRichard Ferrel De BurrsSmith    Jr. is a 54 y.o. male presenting on 05/24/2020 for Diabetes and Hypertension   HPI  George Colon presents to clinic for follow up on his diabetes.  He has recently worked on changing his eating habits and has gone from 279lbs to 262lbs since his last visit 8 weeks ago.  He is interested in reducing his lisinopril from 20mg  daily to 10mg  daily since he has been working on his lifestyle modifications.  Diabetes Pt presents today for follow up Type 2 Diabetes Mellitus.  He/she (caps): He ACTION; IS/IS NOT: is not checking AM CBG at home.  Declines A1C today as has been 2 months since last and is self pay. -Current diabetic medications include: metformin 1000mg  BIC WC, not presently taking -ACTION; IS/IS NOT: is not currently symptomatic -Actions; denies/reports/admits to: denies polydipsia, polyphagia, polyuria, headaches, diaphoresis, shakiness, chills, pain, numbness or tingling in extremities or changes in vision -Clinical course has been unstable -Reports no structured exercise routine -Diet is moderate in salt, moderate in fat, and moderate in carbohydrates  PREVENTION Eye exam current (within 1 year) Due, encouraged Foot exam current (within 1 year) Up to date Lipid/ASCVD risk reduction - on statin: YES/NO: prescribed for simvastatin, not taking Kidney Protection (On ACE/ARB)? YES/NO: Yes   [facility] 03/26/2020 04/20/2019 03/04/2018  Decreased Interest 0 0 0  Down, Depressed, Hopeless 0 0 0  PHQ - 2 Score 0 0 0  Altered sleeping 1 - 0  Tired, decreased energy 0 - 0  Change in appetite 0 - 0  Feeling bad or failure about yourself  0 - 0  Trouble concentrating 0 - 0  Moving slowly or fidgety/restless 0 - 0  Suicidal thoughts 0 - 0  PHQ-9 Score 1 - 0  Difficult doing work/chores Not difficult at all - Not difficult at all    Social  History   Tobacco Use  . Smoking status: Never Smoker  . Smokeless tobacco: Never Used  Substance Use Topics  . Alcohol use: No  . Drug use: No    Review of Systems  Constitutional: Negative.   HENT: Negative.   Eyes: Negative.   Respiratory: Negative.   Cardiovascular: Negative.   Gastrointestinal: Negative.   Endocrine: Negative.   Genitourinary: Negative.   Musculoskeletal: Negative.   Skin: Negative.   Allergic/Immunologic: Negative.   Neurological: Negative.   Hematological: Negative.   Psychiatric/Behavioral: Negative.    Per HPI unless specifically indicated above     Objective:    BP 123/85 (BP Location: Right Arm, Patient Position: Sitting, Cuff Size: Large)   Pulse 69   Temp 98.4 F (36.9 C) (Oral)   Resp 17   Ht 6' (1.829 m)   Wt 262 lb (118.8 kg)   SpO2 98%   BMI 35.53 kg/m   Wt Readings from Last 3 Encounters:  05/24/20 262 lb (118.8 kg)  03/26/20 279 lb (126.6 kg)  08/01/18 285 lb 3.2 oz (129.4 kg)    Physical Exam Vitals reviewed.  Constitutional:      General: He is not in acute distress.    Appearance: Normal appearance. He is well-developed and well-groomed. He is obese. He is not ill-appearing or toxic-appearing.  HENT:     Head: Normocephalic and atraumatic.     Nose:     Comments:  Facemask is in place, covering mouth and nose. Eyes:     General:        Right eye: No discharge.        Left eye: No discharge.     Extraocular Movements: Extraocular movements intact.     Conjunctiva/sclera: Conjunctivae normal.     Pupils: Pupils are equal, round, and reactive to light.  Cardiovascular:     Rate and Rhythm: Normal rate and regular rhythm.     Pulses: Normal pulses.     Heart sounds: Normal heart sounds. No murmur heard.  No friction rub. No gallop.   Pulmonary:     Effort: Pulmonary effort is normal. No respiratory distress.     Breath sounds: Normal breath sounds.  Musculoskeletal:     Right lower leg: No edema.     Left lower  leg: No edema.  Skin:    General: Skin is warm and dry.     Capillary Refill: Capillary refill takes less than 2 seconds.  Neurological:     General: No focal deficit present.     Mental Status: He is alert and oriented to person, place, and time.  Psychiatric:        Attention and Perception: Attention and perception normal.        Mood and Affect: Mood and affect normal.        Speech: Speech normal.        Behavior: Behavior normal. Behavior is cooperative.        Thought Content: Thought content normal.        Cognition and Memory: Cognition and memory normal.    Results for orders placed or performed in visit on 03/27/20  Thyroid Panel With TSH  Result Value Ref Range   T3 Uptake 31 22 - 35 %   T4, Total 7.3 4.9 - 10.5 mcg/dL   Free Thyroxine Index 2.3 1.4 - 3.8   TSH 0.97 0.40 - 4.50 mIU/L  Lipid panel  Result Value Ref Range   Cholesterol 168 <200 mg/dL   HDL 34 (L) > OR = 40 mg/dL   Triglycerides 409 <811 mg/dL   LDL Cholesterol (Calc) 113 (H) mg/dL (calc)   Total CHOL/HDL Ratio 4.9 <5.0 (calc)   Non-HDL Cholesterol (Calc) 134 (H) <130 mg/dL (calc)  COMPLETE METABOLIC PANEL WITH GFR  Result Value Ref Range   Glucose, Bld 314 (H) 65 - 99 mg/dL   BUN 9 7 - 25 mg/dL   Creat 9.14 (L) 7.82 - 1.33 mg/dL   GFR, Est Non African American 112 > OR = 60 mL/min/1.63m2   GFR, Est African American 130 > OR = 60 mL/min/1.32m2   BUN/Creatinine Ratio 14 6 - 22 (calc)   Sodium 136 135 - 146 mmol/L   Potassium 3.9 3.5 - 5.3 mmol/L   Chloride 101 98 - 110 mmol/L   CO2 27 20 - 32 mmol/L   Calcium 9.1 8.6 - 10.3 mg/dL   Total Protein 6.6 6.1 - 8.1 g/dL   Albumin 4.0 3.6 - 5.1 g/dL   Globulin 2.6 1.9 - 3.7 g/dL (calc)   AG Ratio 1.5 1.0 - 2.5 (calc)   Total Bilirubin 0.6 0.2 - 1.2 mg/dL   Alkaline phosphatase (APISO) 61 35 - 144 U/L   AST 27 10 - 35 U/L   ALT 37 9 - 46 U/L  CBC with Differential/Platelet  Result Value Ref Range   WBC 5.9 3.8 - 10.8 Thousand/uL   RBC 4.99 4.20  -  5.80 Million/uL   Hemoglobin 15.2 13.2 - 17.1 g/dL   HCT 84.1 38 - 50 %   MCV 92.4 80.0 - 100.0 fL   MCH 30.5 27.0 - 33.0 pg   MCHC 33.0 32.0 - 36.0 g/dL   RDW 66.0 63.0 - 16.0 %   Platelets 88 (L) 140 - 400 Thousand/uL   MPV 12.3 7.5 - 12.5 fL   Neutro Abs 4,136 1,500 - 7,800 cells/uL   Lymphs Abs 1,263 850 - 3,900 cells/uL   Absolute Monocytes 443 200 - 950 cells/uL   Eosinophils Absolute 30 15 - 500 cells/uL   Basophils Absolute 30 0 - 200 cells/uL   Neutrophils Relative % 70.1 %   Total Lymphocyte 21.4 %   Monocytes Relative 7.5 %   Eosinophils Relative 0.5 %   Basophils Relative 0.5 %      Assessment & Plan:   Problem List Items Addressed This Visit      Cardiovascular and Mediastinum   HTN (hypertension) - Primary    Controlled hypertension.  Pt is working on lifestyle modifications.  Taking medications tolerating well without side effects.  Requesting reduction in lisinopril dosing from 20mg  to 10mg  Complications: Uncontrolled T2DM, atrial fibrillation, obesity  Plan: 1. Can lower dosing to lisinopril 10mg  daily 2. Obtain labs within the next 3 months 3. Encouraged heart healthy diet and increasing exercise to 30 minutes most days of the week, going no more than 2 days in a row without exercise. 4. Check BP 1-2 x per week at home, keep log, and bring to clinic at next appointment. 5. Follow up 3 months.         Relevant Medications   aspirin 325 MG tablet   lisinopril (ZESTRIL) 10 MG tablet   tadalafil (CIALIS) 10 MG tablet   Atrial fibrillation (HCC)    No medication management at this time.  Patient has previously been on carvedilol 3.125mg  BID but reports stopping this medication.  Previously offered and declined referral to cardiology for evaluation and input on treatment plan, declines.  Discussed importance of taking medications as prescribed, as atrial fibrillation can lead to clots within the ventricle and those clots can travel to cause heart attack and  stroke, up to and including death.  Patient verbalized understanding and declines medications.      Relevant Medications   aspirin 325 MG tablet   lisinopril (ZESTRIL) 10 MG tablet   tadalafil (CIALIS) 10 MG tablet     Endocrine   Uncontrolled diabetes mellitus with diabetic neuropathy (HCC)    UncontrolledDM with A1c last checked 03/26/2020 and was 14%.  Will plan to recheck A1C in 4 weeks - Complications - hyperglycemia.  Plan:  1. Continue lifestyle modifications and will recheck A1C in 4 weeks and determine new treatment plan for medications 2. Encourage improved lifestyle: - low carb/low glycemic diet reinforced prior education - Increase physical activity to 30 minutes most days of the week.  Explained that increased physical activity increases body's use of sugar for energy. 3. Check fasting am CBG and log these.  Bring log to next visit for review 4. Continue ASA and ACEi 5. Advised to schedule DM ophtho exam, send record. 6. Follow-up 4 weeks      Relevant Medications   aspirin 325 MG tablet   lisinopril (ZESTRIL) 10 MG tablet    Other Visit Diagnoses    Decreased platelet count (HCC)       Relevant Orders   CBC with Differential   Erectile  dysfunction, unspecified erectile dysfunction type       Relevant Medications   tadalafil (CIALIS) 10 MG tablet   Other Relevant Orders   Testosterone Total,Free,Bio, Males   Sex hormone binding globulin      Meds ordered this encounter  Medications  . lisinopril (ZESTRIL) 10 MG tablet    Sig: Take 1 tablet (10 mg total) by mouth daily.    Dispense:  90 tablet    Refill:  3  . tadalafil (CIALIS) 10 MG tablet    Sig: Take 1 tablet (10 mg total) by mouth daily as needed for erectile dysfunction.    Dispense:  15 tablet    Refill:  1   Follow up plan: Return in about 4 weeks (around 06/21/2020) for DM, A1C, Medication re-evaluation.   Charlaine Dalton, FNP Family Nurse Practitioner [facility] 05/24/2020, 1:10 PM

## 2020-05-24 NOTE — Assessment & Plan Note (Signed)
No medication management at this time.  Patient has previously been on carvedilol 3.125mg  BID but reports stopping this medication.  Previously offered and declined referral to cardiology for evaluation and input on treatment plan, declines.  Discussed importance of taking medications as prescribed, as atrial fibrillation can lead to clots within the ventricle and those clots can travel to cause heart attack and stroke, up to and including death.  Patient verbalized understanding and declines medications.

## 2020-05-24 NOTE — Assessment & Plan Note (Signed)
UncontrolledDM with A1c last checked 03/26/2020 and was 14%.  Will plan to recheck A1C in 4 weeks - Complications - hyperglycemia.  Plan:  1. Continue lifestyle modifications and will recheck A1C in 4 weeks and determine new treatment plan for medications 2. Encourage improved lifestyle: - low carb/low glycemic diet reinforced prior education - Increase physical activity to 30 minutes most days of the week.  Explained that increased physical activity increases body's use of sugar for energy. 3. Check fasting am CBG and log these.  Bring log to next visit for review 4. Continue ASA and ACEi 5. Advised to schedule DM ophtho exam, send record. 6. Follow-up 4 weeks

## 2020-05-24 NOTE — Assessment & Plan Note (Signed)
Controlled hypertension.  Pt is working on lifestyle modifications.  Taking medications tolerating well without side effects.  Requesting reduction in lisinopril dosing from 20mg  to 10mg  Complications: Uncontrolled T2DM, atrial fibrillation, obesity  Plan: 1. Can lower dosing to lisinopril 10mg  daily 2. Obtain labs within the next 3 months 3. Encouraged heart healthy diet and increasing exercise to 30 minutes most days of the week, going no more than 2 days in a row without exercise. 4. Check BP 1-2 x per week at home, keep log, and bring to clinic at next appointment. 5. Follow up 3 months.

## 2020-05-24 NOTE — Patient Instructions (Signed)
Continue working on your lifestyle modifications.  We will plan to see you back in clinic in 4 weeks to recheck your A1C and a medication re-evaluation.  I have sent in a prescription for cialis 10mg  to take 1 tablet every other day for concerns of erectile dysfunction.  You can learn more information online about your diabetes at American Diabetes Association: http://www.diabetes.org/ - General self-care (diet, medications, blood sugar checks). - Diet recommendations - There are even recipes available for you to look at and try.  I have decreased your lisinopril from 20mg  daily to 10mg  daily due to lifestyle changes and your request.  If you find that your blood pressure is consistently over 130/80, to let me know and we will increase back to 20mg  daily.  It is important to take the carvedilol as directed, based on your history of atrial fibrillation.  If you begin to have any fatigue, shortness of breath, leg swelling, to contact sooner for re-evaluation.  You will receive a survey after today's visit either digitally by e-mail or paper by . Your experiences and feedback matter to .  Please respond so we know how we are doing as we provide care for you.  Call with any questions/concerns/needs.  It is my goal to be available to you for your health concerns.  Thanks for choosing me to be a partner in your healthcare needs!  [name], FNP-C Family Nurse Practitioner [facility] Phone: [phone]

## 2020-05-27 ENCOUNTER — Other Ambulatory Visit: Payer: Self-pay

## 2020-05-28 ENCOUNTER — Telehealth: Payer: Self-pay | Admitting: Family Medicine

## 2020-05-28 ENCOUNTER — Encounter: Payer: Self-pay | Admitting: Oncology

## 2020-05-28 ENCOUNTER — Other Ambulatory Visit: Payer: Self-pay | Admitting: Family Medicine

## 2020-05-28 DIAGNOSIS — D696 Thrombocytopenia, unspecified: Secondary | ICD-10-CM

## 2020-05-28 LAB — CBC WITH DIFFERENTIAL/PLATELET
Absolute Monocytes: 336 cells/uL (ref 200–950)
Basophils Absolute: 17 cells/uL (ref 0–200)
Basophils Relative: 0.3 %
Eosinophils Absolute: 12 cells/uL — ABNORMAL LOW (ref 15–500)
Eosinophils Relative: 0.2 %
HCT: 43 % (ref 38.5–50.0)
Hemoglobin: 14.4 g/dL (ref 13.2–17.1)
Lymphs Abs: 934 cells/uL (ref 850–3900)
MCH: 31.2 pg (ref 27.0–33.0)
MCHC: 33.5 g/dL (ref 32.0–36.0)
MCV: 93.1 fL (ref 80.0–100.0)
MPV: 12 fL (ref 7.5–12.5)
Monocytes Relative: 5.8 %
Neutro Abs: 4501 cells/uL (ref 1500–7800)
Neutrophils Relative %: 77.6 %
Platelets: 79 10*3/uL — ABNORMAL LOW (ref 140–400)
RBC: 4.62 10*6/uL (ref 4.20–5.80)
RDW: 13 % (ref 11.0–15.0)
Total Lymphocyte: 16.1 %
WBC: 5.8 10*3/uL (ref 3.8–10.8)

## 2020-05-28 LAB — TESTOSTERONE TOTAL,FREE,BIO, MALES
Albumin: 3.8 g/dL (ref 3.6–5.1)
Sex Hormone Binding: 42 nmol/L (ref 10–50)
Testosterone, Bioavailable: 55.9 ng/dL — ABNORMAL LOW (ref >=110.0)
Testosterone, Free: 31.9 pg/mL — ABNORMAL LOW (ref 46.0–224.0)
Testosterone: 297 ng/dL (ref 250–827)

## 2020-05-28 NOTE — Telephone Encounter (Signed)
Spoke with patient regarding his low platelet count x 2. Discussed sending for referral to hematology for evaluation of decreased platelet count x 2, to hold all NSAIDs and aspirin. Encouraged having HIV and Hep C screening done while awaiting referral to hematology. Patient in agreement, will come to clinic today for Hep C and HIV testing and will schedule with hematology once they contact him.

## 2020-05-28 NOTE — Progress Notes (Signed)
Patient prescreened for New patient visit. Pt referred by Dr. Anitra Lauth for low platelets. Pt reports that he started a drastic diet in which he cut off carbs and has lost 17 lbs in 5 weeks. He thinks this might be contributing to low platelets.

## 2020-05-29 ENCOUNTER — Other Ambulatory Visit: Payer: Self-pay

## 2020-05-29 ENCOUNTER — Inpatient Hospital Stay: Payer: Self-pay

## 2020-05-29 ENCOUNTER — Inpatient Hospital Stay: Payer: Self-pay | Attending: Oncology | Admitting: Oncology

## 2020-05-29 VITALS — BP 137/84 | HR 67 | Temp 96.9°F | Resp 18 | Ht 72.0 in | Wt 266.9 lb

## 2020-05-29 DIAGNOSIS — Z808 Family history of malignant neoplasm of other organs or systems: Secondary | ICD-10-CM

## 2020-05-29 DIAGNOSIS — D696 Thrombocytopenia, unspecified: Secondary | ICD-10-CM

## 2020-05-29 DIAGNOSIS — E119 Type 2 diabetes mellitus without complications: Secondary | ICD-10-CM

## 2020-05-29 NOTE — Progress Notes (Signed)
Hematology/Oncology Consult note [facility] [phone] [phone]   Patient Care Team: Verl Bangs, FNP as PCP - General (Family Medicine) [place], Virl Diamond, [facility] as Pharmacist Minor, Dalbert Garnet, RN (Inactive) as [employer] Greg Cutter, LCSW as Education officer, museum (Licensed Holiday representative)  REFERRING PROVIDER: Lorine Bears, Lupita Raider, FNP  CHIEF COMPLAINTS/REASON FOR VISIT:  Evaluation of thrombocytopenia  HISTORY OF PRESENTING ILLNESS:  George Colon. is a 55 y.o. male who was seen in consultation at the request of Malfi, Lupita Raider, FNP for evaluation of thrombocytopenia   Reviewed patient's labs done previously.  05/27/2020 labs showed decreased platelet counts at 79,000. Normal wbc and normal hemoglobin  Reviewed patient's previous labs.  03/27/2020, CBC showed thrombocytopenia with a platelet count of 88,000. 04/28/2017 CBC showed normal platelet count of 1 55,000. No aggravating or elevated factors.  Patient reports that 56-monthago, he was found to have diabetes, with A1c of 14 and since then he has put a lot of effort in healthy diet and cut down high-calorie food and [facility]  He used to drink a lot of [facility] stay awake as he was 50 minutes one way to go to work.  He works in [employer]  He also takes over-the-counter vitamin supplementations.  He has a history of ED and testosterone was checked recently and was low.  Denies any alcohol use, tonic water with quinine, herbal supplementation.  Associated symptoms or signs:  Denies fever, chills, fatigue, night sweats.  Patient has intentional weight loss, 13 pounds since June 2021 per record. Denies hematochezia, hematuria, hematemesis, epistaxis, black tarry stool.  easy bruising.   Context:  History of hepatitis or HIV infection, denies.  Patient reports that he has had hepatitis panel and HIV done recently and the results are  pending. History of gastric bypass: Denies History of autoimmune disease.  Denies    Review of Systems  Constitutional: Negative for appetite change, chills, fatigue, fever and unexpected weight change.  HENT:   Negative for hearing loss and voice change.   Eyes: Negative for eye problems and icterus.  Respiratory: Negative for chest tightness, cough and shortness of breath.   Cardiovascular: Negative for chest pain and leg swelling.  Gastrointestinal: Negative for abdominal distention and abdominal pain.  Endocrine: Negative for hot flashes.  Genitourinary: Negative for difficulty urinating, dysuria and frequency.   Musculoskeletal: Negative for arthralgias.  Skin: Negative for itching and rash.  Neurological: Negative for light-headedness and numbness.  Hematological: Negative for adenopathy. Does not bruise/bleed easily.  Psychiatric/Behavioral: Negative for confusion.    MEDICAL HISTORY:  Past Medical History:  Diagnosis Date  . Anxiety   . Depression   . Diabetes mellitus [place]   . Hypertension     SURGICAL HISTORY: History reviewed. No pertinent surgical history.  SOCIAL HISTORY: Social History   Socioeconomic History  . Marital status: Single    Spouse name: Not on file  . Number of children: Not on file  . Years of education: Not on file  . Highest education level: Not on file  Occupational History  . Occupation: [employer]  Tobacco Use  . Smoking status: Never Smoker  . Smokeless tobacco: Never Used  Vaping Use  . Vaping Use: Never used  Substance and Sexual Activity  . Alcohol use: No  . Drug use: No  . Sexual activity: Not on file  Other Topics Concern  . Not  on file  Social History Narrative  . Not on file   Social Determinants of Health   Financial Resource Strain:   . Difficulty of Paying Living Expenses: Not on file  Food Insecurity:   . Worried About Charity fundraiser in the Last Year: Not on file  . Ran Out of Food  in the Last Year: Not on file  Transportation Needs:   . Lack of Transportation (Medical): Not on file  . Lack of Transportation (Non-Medical): Not on file  Physical Activity:   . Days of Exercise per Week: Not on file  . Minutes of Exercise per Session: Not on file  Stress:   . Feeling of Stress : Not on file  Social Connections:   . Frequency of Communication with Friends and Family: Not on file  . Frequency of Social Gatherings with Friends and Family: Not on file  . Attends Religious Services: Not on file  . Active Member of Clubs or Organizations: Not on file  . Attends Archivist Meetings: Not on file  . Marital Status: Not on file  Intimate Partner Violence:   . Fear of Current or Ex-Partner: Not on file  . Emotionally Abused: Not on file  . Physically Abused: Not on file  . Sexually Abused: Not on file    FAMILY HISTORY: Family History  Problem Relation Age of Onset  . Heart disease Mother   . Liver cancer Father     ALLERGIES:  has No Known Allergies.  MEDICATIONS:  Current Outpatient Medications  Medication Sig Dispense Refill  . Arginine (L-ARGININE-500) 500 MG CAPS Take 2 capsules by mouth 3 (three) times daily.    Marland Kitchen aspirin 325 MG tablet Take 325 mg by mouth in the morning and at bedtime.    . Cholecalciferol (VITAMIN D3) 25 MCG (1000 UT) CAPS Take 1 capsule by mouth at bedtime.    . levOCARNitine L-Tartrate (L-CARNITINE) 500 MG CAPS Take 1 capsule by mouth in the morning and at bedtime.    Marland Kitchen lisinopril (ZESTRIL) 10 MG tablet Take 1 tablet (10 mg total) by mouth daily. 90 tablet 3  . Magnesium 250 MG TABS Take by mouth.    . Multiple Vitamin (MULTIVITAMIN) tablet Take 1 tablet by mouth daily.    Marland Kitchen RELION LANCETS STANDARD 21G MISC 1 Device by Does not apply route daily. 100 each 3  . tadalafil (CIALIS) 10 MG tablet Take 1 tablet (10 mg total) by mouth daily as needed for erectile dysfunction. 15 tablet 1  . vitamin B-12 (CYANOCOBALAMIN) 500 MCG tablet  Take 500 mcg by mouth daily.    Marland Kitchen zinc gluconate 50 MG tablet Take 50 mg by mouth daily.    . metFORMIN (GLUCOPHAGE) 500 MG tablet Take 1 tablet (500 mg total) by mouth daily with breakfast for 7 days, THEN 1 tablet (500 mg total) 2 (two) times daily with a meal for 7 days, THEN 2 tablets (1,000 mg total) 2 (two) times daily with a meal. (Patient not taking: Reported on 05/28/2020) 381 tablet 0   No current facility-administered medications for this visit.     PHYSICAL EXAMINATION: ECOG PERFORMANCE STATUS: 0 - Asymptomatic Vitals:   05/29/20 0935  BP: 137/84  Pulse: 67  Resp: 18  Temp: (!) 96.9 F (36.1 C)   Filed Weights   05/29/20 0935  Weight: 266 lb 14.4 oz (121.1 kg)    Physical Exam Constitutional:      General: He is not in acute distress.  HENT:     Head: Normocephalic and atraumatic.  Eyes:     General: No scleral icterus. Cardiovascular:     Rate and Rhythm: Normal rate and regular rhythm.     Heart sounds: Normal heart sounds.  Pulmonary:     Effort: Pulmonary effort is normal. No respiratory distress.     Breath sounds: No wheezing.  Abdominal:     General: Bowel sounds are normal. There is no distension.     Palpations: Abdomen is soft.  Musculoskeletal:        General: No deformity. Normal range of motion.     Cervical back: Normal range of motion and neck supple.  Skin:    General: Skin is warm and dry.     Findings: No erythema or rash.     Comments: Multiple skin tags: Neck, bilateral axillary  Neurological:     Mental Status: He is alert and oriented to person, place, and time. Mental status is at baseline.     Cranial Nerves: No cranial nerve deficit.     Coordination: Coordination normal.  Psychiatric:        Mood and Affect: Mood normal.      LABORATORY DATA:  I have reviewed the data as listed Lab Results  Component Value Date   WBC 5.8 05/27/2020   HGB 14.4 05/27/2020   HCT 43.0 05/27/2020   MCV 93.1 05/27/2020   PLT 79 (L)  05/27/2020   Recent Labs    03/27/20 1118  NA 136  K 3.9  CL 101  CO2 27  GLUCOSE 314*  BUN 9  CREATININE 0.64*  CALCIUM 9.1  GFRNONAA 112  GFRAA 130  PROT 6.6  AST 27  ALT 37  BILITOT 0.6   Iron/TIBC/Ferritin/ %Sat No results found for: IRON, TIBC, FERRITIN, IRONPCTSAT    RADIOGRAPHIC STUDIES: I have personally reviewed the radiological images as listed and agreed with the findings in the report.  No results found.   ASSESSMENT & PLAN:  1. Thrombocytopenia ([place])    For the work up of patient's thrombocytopenia, I recommend checking CBC;CMP, LDH;  folate, Vitamin B12,  ANA,  flowcytometry and monoclonal gammopathy workup.  TSH has been previously tested in June 2021 has been normal.  Hepatitis and HIV were ordered by primary care provider with results pending Also, discussed with the patient that if no clear etiology found- bone marrow biopsy would be suggested. Currently await for the above workup.   # Patient follow-up with me in approximately 2 weeks to review the above results.   Orders Placed This Encounter  Procedures  . CBC with Differential/Platelet    Standing Status:   Future    Number of Occurrences:   1    Standing Expiration Date:   05/29/2021  . Immature Platelet Fraction    Standing Status:   Future    Number of Occurrences:   1    Standing Expiration Date:   05/29/2021  . Lactate dehydrogenase    Standing Status:   Future    Number of Occurrences:   1    Standing Expiration Date:   05/29/2021  . Vitamin B12    Standing Status:   Future    Number of Occurrences:   1    Standing Expiration Date:   05/29/2021  . Folate    Standing Status:   Future    Number of Occurrences:   1    Standing Expiration Date:   05/29/2021  . Ferritin  Standing Status:   Future    Number of Occurrences:   1    Standing Expiration Date:   05/29/2021  . Iron and TIBC    Standing Status:   Future    Number of Occurrences:   1    Standing Expiration Date:   05/29/2021  .  Technologist smear review    Standing Status:   Future    Number of Occurrences:   1    Standing Expiration Date:   05/29/2021  . Flow cytometry panel-leukemia/lymphoma work-up    Standing Status:   Future    Number of Occurrences:   1    Standing Expiration Date:   05/29/2021  . Multiple Myeloma Panel (SPEP&IFE w/QIG)    Standing Status:   Future    Number of Occurrences:   1    Standing Expiration Date:   05/29/2021  . Kappa/lambda light chains    Standing Status:   Future    Number of Occurrences:   1    Standing Expiration Date:   05/29/2021  . ANA w/Reflex    Standing Status:   Future    Number of Occurrences:   1    Standing Expiration Date:   05/29/2021  . ANA w/Reflex    Standing Status:   Future    Number of Occurrences:   1    Standing Expiration Date:   05/29/2021  . CBC with Differential/Platelet    Standing Status:   Future    Number of Occurrences:   1    Standing Expiration Date:   05/29/2021  . Lactate dehydrogenase    Standing Status:   Future    Number of Occurrences:   1    Standing Expiration Date:   05/29/2021  . Vitamin B12    Standing Status:   Future    Number of Occurrences:   1    Standing Expiration Date:   05/29/2021  . Folate    Standing Status:   Future    Number of Occurrences:   1    Standing Expiration Date:   05/29/2021  . Ferritin    Standing Status:   Future    Number of Occurrences:   1    Standing Expiration Date:   05/29/2021  . Iron and TIBC    Standing Status:   Future    Number of Occurrences:   1    Standing Expiration Date:   05/29/2021  . Flow cytometry panel-leukemia/lymphoma work-up  . Multiple Myeloma Panel (SPEP&IFE w/QIG)    Standing Status:   Future    Number of Occurrences:   1    Standing Expiration Date:   05/29/2021  . Kappa/lambda light chains    Standing Status:   Future    Number of Occurrences:   1    Standing Expiration Date:   05/29/2021  . Technologist smear review    Standing Status:   Future    Number of Occurrences:   1      Standing Expiration Date:   05/29/2021  . Immature Platelet Fraction    Standing Status:   Future    Number of Occurrences:   1    Standing Expiration Date:   05/29/2021  . ANA, IFA (with reflex)    Standing Status:   Future    Number of Occurrences:   1    Standing Expiration Date:   05/29/2021  . CBC with Differential/Platelet    Standing Status:  Future    Number of Occurrences:   1    Standing Expiration Date:   05/29/2021  . Lactate dehydrogenase    Standing Status:   Future    Number of Occurrences:   1    Standing Expiration Date:   05/29/2021  . Vitamin B12    Standing Status:   Future    Number of Occurrences:   1    Standing Expiration Date:   05/29/2021  . Folate    Standing Status:   Future    Number of Occurrences:   1    Standing Expiration Date:   05/29/2021  . Ferritin    Standing Status:   Future    Number of Occurrences:   1    Standing Expiration Date:   05/29/2021  . Iron and TIBC    Standing Status:   Future    Number of Occurrences:   1    Standing Expiration Date:   05/29/2021  . Multiple Myeloma Panel (SPEP&IFE w/QIG)    Standing Status:   Future    Number of Occurrences:   1    Standing Expiration Date:   05/29/2021  . Kappa/lambda light chains    Standing Status:   Future    Number of Occurrences:   1    Standing Expiration Date:   05/29/2021  . Technologist smear review    Standing Status:   Future    Number of Occurrences:   1    Standing Expiration Date:   05/29/2021  . Immature Platelet Fraction    Standing Status:   Future    Number of Occurrences:   1    Standing Expiration Date:   05/29/2021  . Flow cytometry panel-leukemia/lymphoma work-up    Standing Status:   Future    Number of Occurrences:   1    Standing Expiration Date:   05/29/2021  . ANA w/Reflex    Standing Status:   Future    Number of Occurrences:   1    Standing Expiration Date:   05/29/2021  . Ferritin    Standing Status:   Future    Number of Occurrences:   1    Standing Expiration  Date:   05/29/2021  . Flow cytometry panel-leukemia/lymphoma work-up    Standing Status:   Future    Number of Occurrences:   1    Standing Expiration Date:   05/29/2021  . CBC with Differential/Platelet    Standing Status:   Future    Number of Occurrences:   1    Standing Expiration Date:   05/29/2021  . Folate    Standing Status:   Future    Number of Occurrences:   1    Standing Expiration Date:   05/29/2021  . Immature Platelet Fraction    Standing Status:   Future    Number of Occurrences:   1    Standing Expiration Date:   05/29/2021  . Iron and TIBC    Standing Status:   Future    Number of Occurrences:   1    Standing Expiration Date:   05/29/2021  . Vitamin B12    Standing Status:   Future    Number of Occurrences:   1    Standing Expiration Date:   05/29/2021  . Multiple Myeloma Panel (SPEP&IFE w/QIG)    Standing Status:   Future    Number of Occurrences:   1    Standing Expiration Date:  05/29/2021  . Lactate dehydrogenase    Standing Status:   Future    Number of Occurrences:   1    Standing Expiration Date:   05/29/2021  . Kappa/lambda light chains    Standing Status:   Future    Number of Occurrences:   1    Standing Expiration Date:   05/29/2021  . Technologist smear review    Standing Status:   Future    Standing Expiration Date:   05/29/2021    All questions were answered. The patient knows to call the clinic with any problems questions or concerns.  Cc Malfi, Lupita Raider, FNP  Return of visit: 2 weeks.  Thank you for this kind referral and the opportunity to participate in the care of this patient. A copy of today's note is routed to referring provider    Earlie Server, MD, PhD 05/29/2020

## 2020-05-30 LAB — CBC WITH DIFFERENTIAL/PLATELET
Abs Immature Granulocytes: 0.03 10*3/uL (ref 0.00–0.07)
Basophils Absolute: 0 10*3/uL (ref 0.0–0.1)
Basophils Relative: 1 %
Eosinophils Absolute: 0 10*3/uL (ref 0.0–0.5)
Eosinophils Relative: 1 %
HCT: 41.9 % (ref 39.0–52.0)
Hemoglobin: 14.8 g/dL (ref 13.0–17.0)
Immature Granulocytes: 1 %
Lymphocytes Relative: 24 %
Lymphs Abs: 1.5 10*3/uL (ref 0.7–4.0)
MCH: 31 pg (ref 26.0–34.0)
MCHC: 35.3 g/dL (ref 30.0–36.0)
MCV: 87.8 fL (ref 80.0–100.0)
Monocytes Absolute: 0.4 10*3/uL (ref 0.1–1.0)
Monocytes Relative: 7 %
Neutro Abs: 4.1 10*3/uL (ref 1.7–7.7)
Neutrophils Relative %: 66 %
Platelets: 139 10*3/uL — ABNORMAL LOW (ref 150–400)
RBC: 4.77 MIL/uL (ref 4.22–5.81)
RDW: 12.6 % (ref 11.5–15.5)
WBC: 6 10*3/uL (ref 4.0–10.5)
nRBC: 0 % (ref 0.0–0.2)

## 2020-05-30 LAB — LACTATE DEHYDROGENASE: LDH: 124 U/L (ref 98–192)

## 2020-05-30 LAB — IRON AND TIBC
Iron: 76 ug/dL (ref 45–182)
Saturation Ratios: 23 % (ref 17.9–39.5)
TIBC: 337 ug/dL (ref 250–450)
UIBC: 261 ug/dL

## 2020-05-30 LAB — FERRITIN: Ferritin: 112 ng/mL (ref 24–336)

## 2020-05-30 LAB — IMMATURE PLATELET FRACTION: Immature Platelet Fraction: 10.8 % — ABNORMAL HIGH (ref 1.2–8.6)

## 2020-05-30 LAB — VITAMIN B12: Vitamin B-12: 326 pg/mL (ref 180–914)

## 2020-05-30 LAB — FOLATE: Folate: 14.4 ng/mL (ref >5.9)

## 2020-05-31 ENCOUNTER — Telehealth: Payer: Self-pay | Admitting: Family Medicine

## 2020-05-31 LAB — KAPPA/LAMBDA LIGHT CHAINS
Kappa free light chain: 19 mg/L (ref 3.3–19.4)
Kappa, lambda light chain ratio: 1.34 (ref 0.26–1.65)
Lambda free light chains: 14.2 mg/L (ref 5.7–26.3)

## 2020-05-31 LAB — ANA W/REFLEX: Anti Nuclear Antibody (ANA): NEGATIVE

## 2020-05-31 LAB — TECHNOLOGIST SMEAR REVIEW
Plt Morphology: NORMAL
RBC Morphology: NORMAL

## 2020-05-31 NOTE — Telephone Encounter (Signed)
Pts phone is currently cut off and just wanted to let you know incased you tired reaching him with lab results. He said he is on my chart and you can send him a message there.

## 2020-06-01 LAB — MULTIPLE MYELOMA PANEL, SERUM
Albumin SerPl Elph-Mcnc: 3.6 g/dL (ref 2.9–4.4)
Albumin/Glob SerPl: 1.2 (ref 0.7–1.7)
Alpha 1: 0.3 g/dL (ref 0.0–0.4)
Alpha2 Glob SerPl Elph-Mcnc: 0.6 g/dL (ref 0.4–1.0)
B-Globulin SerPl Elph-Mcnc: 1.2 g/dL (ref 0.7–1.3)
Gamma Glob SerPl Elph-Mcnc: 1.1 g/dL (ref 0.4–1.8)
Globulin, Total: 3.1 g/dL (ref 2.2–3.9)
IgA: 404 mg/dL — ABNORMAL HIGH (ref 90–386)
IgG (Immunoglobin G), Serum: 940 mg/dL (ref 603–1613)
IgM (Immunoglobulin M), Srm: 55 mg/dL (ref 20–172)
Total Protein ELP: 6.7 g/dL (ref 6.0–8.5)

## 2020-06-04 LAB — COMP PANEL: LEUKEMIA/LYMPHOMA

## 2020-06-12 ENCOUNTER — Inpatient Hospital Stay: Payer: Self-pay | Admitting: Oncology

## 2020-06-13 LAB — HEPATITIS C ANTIBODY
Hepatitis C Ab: NONREACTIVE
SIGNAL TO CUT-OFF: 0.01 (ref <1.00)

## 2020-06-13 LAB — HIV ANTIBODY (ROUTINE TESTING W REFLEX): HIV 1&2 Ab, 4th Generation: NONREACTIVE

## 2020-06-21 ENCOUNTER — Encounter: Payer: Self-pay | Admitting: Family Medicine

## 2020-06-21 ENCOUNTER — Ambulatory Visit (INDEPENDENT_AMBULATORY_CARE_PROVIDER_SITE_OTHER): Payer: Self-pay | Admitting: Family Medicine

## 2020-06-21 ENCOUNTER — Other Ambulatory Visit: Payer: Self-pay

## 2020-06-21 VITALS — BP 150/89 | HR 59 | Temp 98.1°F | Resp 18 | Ht 72.0 in | Wt 265.8 lb

## 2020-06-21 DIAGNOSIS — E1165 Type 2 diabetes mellitus with hyperglycemia: Secondary | ICD-10-CM

## 2020-06-21 DIAGNOSIS — I10 Essential (primary) hypertension: Secondary | ICD-10-CM

## 2020-06-21 DIAGNOSIS — IMO0002 Reserved for concepts with insufficient information to code with codable children: Secondary | ICD-10-CM

## 2020-06-21 DIAGNOSIS — E1140 Type 2 diabetes mellitus with diabetic neuropathy, unspecified: Secondary | ICD-10-CM

## 2020-06-21 DIAGNOSIS — N529 Male erectile dysfunction, unspecified: Secondary | ICD-10-CM | POA: Insufficient documentation

## 2020-06-21 DIAGNOSIS — R7989 Other specified abnormal findings of blood chemistry: Secondary | ICD-10-CM | POA: Insufficient documentation

## 2020-06-21 LAB — POCT GLYCOSYLATED HEMOGLOBIN (HGB A1C)

## 2020-06-21 MED ORDER — TADALAFIL 10 MG PO TABS: 10.0000 mg | ORAL_TABLET | Freq: Every day | ORAL | 1 refills | Status: DC | PRN

## 2020-06-21 MED ORDER — LISINOPRIL 10 MG PO TABS: 20.0000 mg | ORAL_TABLET | Freq: Every day | ORAL | 3 refills | Status: DC

## 2020-06-21 NOTE — Assessment & Plan Note (Signed)
Uncontrolled hypertension.  BP is not at goal < 130/80.  Pt is working on lifestyle modifications.  Taking medications tolerating well without side effects.  Complications:  T2DM, Obesity  Plan: 1. INCREASE Lisinopril from 10mg  to 20mg  daily 2. Obtain labs at next visit  3. Encouraged heart healthy diet and increasing exercise to 30 minutes most days of the week, going no more than 2 days in a row without exercise. 4. Check BP 1-2 x per week at home, keep log, and bring to clinic at next appointment. 5. Follow up 3 months.

## 2020-06-21 NOTE — Assessment & Plan Note (Signed)
See erectile dysfunction A/P

## 2020-06-21 NOTE — Assessment & Plan Note (Signed)
Labs with low testosterone, has been taking cialis with some improvement, is interested in meeting with a provider to replace his low testosterone.  Discussed referral to Urology.  Patient interested in having medications continued once stable with Urology in primary care office.  Patient to discuss with Urology provider once established and treated.  Plan: 1. Continue cialis as directed 2. Referral to urology placed

## 2020-06-21 NOTE — Assessment & Plan Note (Signed)
Well-controlledDM with A1c 7.1% improved from 14.0% on 03/26/2020 and goal A1c < 7.0%. -Complications - hyperglycemia.  Plan:  1. Continue current therapy: Lifestyle and dietary modifications 2. Encourage improved lifestyle: - low carb/low glycemic diet reinforced prior education - Increase physical activity to 30 minutes most days of the week.  Explained that increased physical activity increases body's use of sugar for energy. 3. Check fasting am CBG and log these.  Bring log to next visit for review 4. Continue ASA and ACEi 5. Advised to schedule DM ophtho exam, send record. 6. Follow-up 3 months

## 2020-06-21 NOTE — Progress Notes (Signed)
Subjective:    Patient ID: Caesar Bookman., male    DOB: December 21, 1965, 54 y.o.   MRN: [id number]  Azeem Poorman Torez Beauregard. is a 54 y.o. male presenting on 06/21/2020 for Diabetes, Erectile Dysfunction, and Hypertension   HPI   Mr. Katrinka Blazing is requesting referral to urology for replacement of low testosterone.  Diabetes Pt presents today for follow up Type 2 Diabetes Mellitus.  He/she (caps): He ACTION; IS/IS NOT: is checking AM CBG at home. -Current diabetic medications include: None, lifestyle and dietary modifications -ACTION; IS/IS NOT: is not currently symptomatic -Actions; denies/reports/admits to: denies polydipsia, polyphagia, polyuria, headaches, diaphoresis, shakiness, chills, pain, numbness or tingling in extremities or changes in vision -Clinical course has been improving  -Reports no structured exercise routine -Diet is moderate in salt, moderate in fat, and moderate in carbohydrates  PREVENTION Eye exam current (within 1 year) Due, encouraged Foot exam current (within 1 year) Up to date Lipid/ASCVD risk reduction - on statin: YES/NO: No  Kidney Protection (On ACE/ARB)? YES/NO: Yes   Hypertension - He is checking BP at home or outside of clinic.  Readings 150's/90's - Current medications: lisinopril 10mg  daily, tolerating well without side effects - He is not currently symptomatic. - Pt denies headache, lightheadedness, dizziness, changes in vision, chest tightness/pressure, palpitations, leg swelling, sudden loss of speech or loss of consciousness. - He  reports no regular exercise routine. - His diet is moderate in salt, moderate in fat, and moderate in carbohydrates.  Depression screen [facility] 2/9 03/26/2020 04/20/2019 03/04/2018  Decreased Interest 0 0 0  Down, Depressed, Hopeless 0 0 0  PHQ - 2 Score 0 0 0  Altered sleeping 1 - 0  Tired, decreased energy 0 - 0  Change in appetite 0 - 0  Feeling bad or failure about yourself  0 - 0  Trouble concentrating  0 - 0  Moving slowly or fidgety/restless 0 - 0  Suicidal thoughts 0 - 0  PHQ-9 Score 1 - 0  Difficult doing work/chores Not difficult at all - Not difficult at all    Social History   Tobacco Use  . Smoking status: Never Smoker  . Smokeless tobacco: Never Used  Vaping Use  . Vaping Use: Never used  Substance Use Topics  . Alcohol use: No  . Drug use: No    Review of Systems  Constitutional: Negative.   HENT: Negative.   Eyes: Negative.   Respiratory: Negative.   Cardiovascular: Negative.   Gastrointestinal: Negative.   Endocrine: Negative.   Genitourinary: Negative.   Musculoskeletal: Negative.   Skin: Negative.   Allergic/Immunologic: Negative.   Neurological: Negative.   Hematological: Negative.   Psychiatric/Behavioral: Negative.    Per HPI unless specifically indicated above     Objective:    BP (!) 150/89 (BP Location: Right Arm, Patient Position: Sitting, Cuff Size: Large)   Pulse (!) 59   Temp 98.1 F (36.7 C) (Oral)   Resp 18   Ht 6' (1.829 m)   Wt 265 lb 12.8 oz (120.6 kg)   SpO2 100%   BMI 36.05 kg/m   Wt Readings from Last 3 Encounters:  06/21/20 265 lb 12.8 oz (120.6 kg)  05/29/20 266 lb 14.4 oz (121.1 kg)  05/24/20 262 lb (118.8 kg)    Physical Exam Vitals reviewed.  Constitutional:      General: He is not in acute distress.    Appearance: Normal appearance. He is well-developed and well-groomed.  He is obese. He is not ill-appearing or toxic-appearing.  HENT:     Head: Normocephalic and atraumatic.     Nose:     Comments: Lesia Sago is in place, covering mouth and nose. Eyes:     General:        Right eye: No discharge.        Left eye: No discharge.     Extraocular Movements: Extraocular movements intact.     Conjunctiva/sclera: Conjunctivae normal.     Pupils: Pupils are equal, round, and reactive to light.  Cardiovascular:     Rate and Rhythm: Normal rate and regular rhythm.     Pulses: Normal pulses.     Heart sounds: Normal  heart sounds. No murmur heard.  No friction rub. No gallop.   Pulmonary:     Effort: Pulmonary effort is normal. No respiratory distress.     Breath sounds: Normal breath sounds.  Musculoskeletal:     Right lower leg: No edema.     Left lower leg: No edema.  Skin:    General: Skin is warm and dry.     Capillary Refill: Capillary refill takes less than 2 seconds.  Neurological:     General: No focal deficit present.     Mental Status: He is alert and oriented to person, place, and time.  Psychiatric:        Attention and Perception: Attention and perception normal.        Mood and Affect: Mood and affect normal.        Speech: Speech normal.        Behavior: Behavior normal. Behavior is cooperative.        Thought Content: Thought content normal.        Cognition and Memory: Cognition and memory normal.    Results for orders placed or performed in visit on 06/21/20  POCT glycosylated hemoglobin (Hb A1C)  Result Value Ref Range   Hemoglobin A1C     HbA1c POC (<> result, manual entry)     HbA1c, POC (prediabetic range)     HbA1c, POC (controlled diabetic range)        Assessment & Plan:   Problem List Items Addressed This Visit      Cardiovascular and Mediastinum   HTN (hypertension)    Uncontrolled hypertension.  BP is not at goal < 130/80.  Pt is working on lifestyle modifications.  Taking medications tolerating well without side effects.  Complications:  T2DM, Obesity  Plan: 1. INCREASE Lisinopril from 10mg  to 20mg  daily 2. Obtain labs at next visit  3. Encouraged heart healthy diet and increasing exercise to 30 minutes most days of the week, going no more than 2 days in a row without exercise. 4. Check BP 1-2 x per week at home, keep log, and bring to clinic at next appointment. 5. Follow up 3 months.         Relevant Medications   lisinopril (ZESTRIL) 10 MG tablet   tadalafil (CIALIS) 10 MG tablet     Endocrine   Uncontrolled diabetes mellitus with diabetic  neuropathy (HCC) - Primary    Well-controlledDM with A1c 7.1% improved from 14.0% on 03/26/2020 and goal A1c < 7.0%. -Complications - hyperglycemia.  Plan:  1. Continue current therapy: Lifestyle and dietary modifications 2. Encourage improved lifestyle: - low carb/low glycemic diet reinforced prior education - Increase physical activity to 30 minutes most days of the week.  Explained that increased physical activity increases body's use of sugar  for energy. 3. Check fasting am CBG and log these.  Bring log to next visit for review 4. Continue ASA and ACEi 5. Advised to schedule DM ophtho exam, send record. 6. Follow-up 3 months      Relevant Medications   lisinopril (ZESTRIL) 10 MG tablet   Other Relevant Orders   POCT glycosylated hemoglobin (Hb A1C) (Completed)     Other   Erectile dysfunction    Labs with low testosterone, has been taking cialis with some improvement, is interested in meeting with a provider to replace his low testosterone.  Discussed referral to Urology.  Patient interested in having medications continued once stable with Urology in primary care office.  Patient to discuss with Urology provider once established and treated.  Plan: 1. Continue cialis as directed 2. Referral to urology placed      Relevant Medications   tadalafil (CIALIS) 10 MG tablet   Other Relevant Orders   Ambulatory referral to Urology   Low testosterone in male    See erectile dysfunction A/P      Relevant Orders   Ambulatory referral to Urology      Meds ordered this encounter  Medications  . lisinopril (ZESTRIL) 10 MG tablet    Sig: Take 2 tablets (20 mg total) by mouth daily.    Dispense:  180 tablet    Refill:  3  . tadalafil (CIALIS) 10 MG tablet    Sig: Take 1 tablet (10 mg total) by mouth daily as needed for erectile dysfunction.    Dispense:  15 tablet    Refill:  1    Follow up plan: Return in about 3 months (around 09/20/2020) for DM, A1C f/u visit.   Charlaine Dalton, FNP Family Nurse Practitioner [facility] 06/21/2020, 1:21 PM

## 2020-06-21 NOTE — Patient Instructions (Signed)
Increase your Lisinopril back to the 20mg  instead of the 10mg  dosing.  Take your blood pressure 1-2x per day and record in a log.  Our goal is to get your blood pressure under 130/80.  Continue with your lifestyle modifications.  A referral to Urology for low testosterone and ED has been placed today.  If you have not heard from the specialty office or our referral coordinator within 1 week, please let know and we will follow up with the referral coordinator for an update.  Try to get exercise a minimum of 30 minutes per day at least 5 days per week as well as  adequate water intake all while measuring blood pressure a few times per week.  Keep a blood pressure log and bring back to clinic at your next visit.  If your readings are consistently over 130/80 to contact our office/send me a MyChart message and we will see you sooner.  Can try DASH and Mediterranean diet options, avoiding processed foods, lowering sodium intake, avoiding pork products, and eating a plant based diet for optimal health.  You can learn more information online about your diabetes at American Diabetes Association: http://www.diabetes.org/ - General self-care (diet, medications, blood sugar checks). - Diet recommendations - There are even recipes available for you to look at and try.  Contact Hematology and reschedule your missed appointment from 06/12/2020.  We will plan to see you back in 3 months for diabetes and hypertension follow up visit  You will receive a survey after today's visit either digitally by e-mail or paper by USPS mail. Your experiences and feedback matter to [name].  Please respond so we know how we are doing as we provide care for you.  Call 06/14/2020 with any questions/concerns/needs.  It is my goal to be available to you for your health concerns.  Thanks for choosing me to be a partner in your healthcare needs!  [name], FNP-C Family Nurse Practitioner [facility] Phone: [phone]

## 2020-08-13 ENCOUNTER — Other Ambulatory Visit: Payer: Self-pay | Admitting: Family Medicine

## 2020-08-13 DIAGNOSIS — N529 Male erectile dysfunction, unspecified: Secondary | ICD-10-CM

## 2020-08-13 MED ORDER — TADALAFIL 10 MG PO TABS: 10.0000 mg | ORAL_TABLET | Freq: Every day | ORAL | 0 refills | Status: DC | PRN

## 2020-08-13 NOTE — Telephone Encounter (Signed)
Medication Refill - Medication: Cialis   Has the patient contacted their pharmacy? Yes.   (Agent: If no, request that the patient contact the pharmacy for the refill.) (Agent: If yes, when and what did the pharmacy advise?)  Preferred Pharmacy (with phone number or street name):  [facility] [address] [place] - [address]  [address] [place] [address]  Phone: [phone] Fax: [phone]  Hours: Not open 24 hours     Agent: Please be advised that RX refills may take up to 3 business days. We ask that you follow-up with your pharmacy.

## 2020-08-16 ENCOUNTER — Ambulatory Visit
Admission: EM | Admit: 2020-08-16 | Discharge: 2020-08-16 | Disposition: A | Discharge status: Home / Self Care | Payer: Self-pay | Attending: Emergency Medicine | Admitting: Emergency Medicine

## 2020-08-16 ENCOUNTER — Other Ambulatory Visit: Payer: Self-pay

## 2020-08-16 DIAGNOSIS — S91302A Unspecified open wound, left foot, initial encounter: Secondary | ICD-10-CM | POA: Insufficient documentation

## 2020-08-16 MED ORDER — MUPIROCIN CALCIUM 2 % NA OINT: TOPICAL_OINTMENT | NASAL | 0 refills | Status: DC

## 2020-08-16 NOTE — ED Provider Notes (Signed)
[facility]    CSN: [id number] Arrival date & time: 08/16/20  1019      History   Chief Complaint Chief Complaint  Patient presents with  . Foot Pain    HPI George George Colon    Jr. is a 54 y.o. male.   HPI   54 year old male here for evaluation of open sores on his left foot.  Patient reports that he first noticed spots on his left toes 2 days ago.  Patient reports that he lives in a mobile home and does not have a shower.  He has to get out of the shower and the house his son rents.  Patient states that he has not noticed any discharge or drainage on his sock.  Patient states that he has not noticed an odor.  Patient states that when he tried to clean up the sores some of the skin came off.  Patient denies fever.  Patient has a history of uncontrolled diabetes.  He admits that he does not go to the doctor as often as he should and he has never seen a podiatrist.  He has history of diabetic peripheral neuropathy.  Patient denies being a smoker.  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  Patient states that his shoes do fit tight.  Past Medical History:  Diagnosis Date  . Anxiety   . Depression   . Diabetes mellitus without complication (HCC)   . Hypertension     Patient Active Problem List   Diagnosis Date Noted  . Erectile dysfunction 06/21/2020  . Low testosterone in male 06/21/2020  . Decreased pedal pulses 03/05/2018  . Uncontrolled diabetes mellitus with diabetic neuropathy (HCC) 07/02/2015  . HTN (hypertension) 07/02/2015  . Atrial fibrillation (HCC) 07/02/2015    History reviewed. No pertinent surgical history.     Home Medications    Prior to Admission medications   Medication Sig Start Date End Date Taking? Authorizing Provider  aspirin 325 MG tablet Take 325 mg by mouth once as needed.     [provider]  lisinopril (ZESTRIL) 10 MG tablet Take 2 tablets (20 mg total) by mouth daily. 06/21/20   Malfi,  Jodelle GrossNicole M, FNP  metFORMIN (GLUCOPHAGE) 500 MG tablet Take 1 tablet (500 mg total) by mouth daily with breakfast for 7 days, THEN 1 tablet (500 mg total) 2 (two) times daily with a meal for 7 days, THEN 2 tablets (1,000 mg total) 2 (two) times daily with a meal. Patient not taking: Reported on 05/28/2020 03/26/20 07/08/20  Tarri FullerMalfi, Nicole M, FNP  mupirocin nasal ointment (BACTROBAN) 2 % Apply to wound 3 times a day. 08/16/20   Becky Augustayan, Sharonna Vinje, NP  RELION LANCETS STANDARD 21G MISC 1 Device by Does not apply route daily. Patient not taking: Reported on 06/21/2020 08/01/18   Galen ManilaKennedy, Lauren Renee, NP  tadalafil (CIALIS) 10 MG tablet Take 1 tablet (10 mg total) by mouth daily as needed for erectile dysfunction. 08/13/20   Malfi, Jodelle GrossNicole M, FNP    Family History Family History  Problem Relation Age of Onset  . Heart disease Mother   . Liver cancer Father     Social History Social History   Tobacco Use  . Smoking status: Never Smoker  . Smokeless tobacco: Never Used  Vaping Use  . Vaping Use: Never used  Substance Use Topics  . Alcohol use: No  . Drug use: No     Allergies   Patient has no  known allergies.   Review of Systems Review of Systems  Constitutional: Negative for activity change, appetite change and fever.  Respiratory: Negative for shortness of breath.   Cardiovascular: Negative for leg swelling.  Musculoskeletal: Negative for arthralgias, joint swelling and myalgias.  Skin: Positive for color change and wound.  Neurological: Positive for numbness. Negative for weakness.  Hematological: Negative.   Psychiatric/Behavioral: Negative.      Physical Exam Triage Vital Signs ED Triage Vitals [08/16/20 1032]  Enc Vitals Group     BP (!) 172/105     Pulse Rate 60     Resp (!) 21     Temp 97.9 F (36.6 C)     Temp Source Oral     SpO2 100 %     Weight      Height      Head Circumference      Peak Flow      Pain Score      Pain Loc      Pain Edu?      Excl. in GC?     No data found.  Updated Vital Signs BP (!) 172/105 (BP Location: Left Arm)   Pulse 60   Temp 97.9 F (36.6 C) (Oral)   Resp (!) 21   SpO2 100%   Visual Acuity Right Eye Distance:   Left Eye Distance:   Bilateral Distance:    Right Eye Near:   Left Eye Near:    Bilateral Near:     Physical Exam Vitals and nursing note reviewed.  Constitutional:      General: He is not in acute distress.    Appearance: Normal appearance. He is obese. He is not toxic-appearing.  HENT:     Head: Normocephalic and atraumatic.  Eyes:     General: No scleral icterus.    Extraocular Movements: Extraocular movements intact.     Conjunctiva/sclera: Conjunctivae normal.     Pupils: Pupils are equal, round, and reactive to light.  Cardiovascular:     Rate and Rhythm: Normal rate and regular rhythm.     Pulses: Normal pulses.     Heart sounds: Normal heart sounds. No murmur heard.  No gallop.   Pulmonary:     Effort: Pulmonary effort is normal.     Breath sounds: Normal breath sounds. No wheezing, rhonchi or rales.  Musculoskeletal:        General: No tenderness or deformity. Normal range of motion.  Skin:    Capillary Refill: Capillary refill takes less than 2 seconds.     Findings: Lesion present. No erythema.     Comments: Patient's feet are cool to touch.  Capillary refill 4 seconds.  DP and PT pulses are 2+.  On the patient's left foot on the lateral aspect of the PIP joint of the second toe there is a darkened circle that is hard that is the size of an eraser head.  On the third toe on the lateral aspect extending from the middle of the proximal phalanx to the distal aspect of the middle phalanx there is an excoriated area with a beefy red wound base.  No drainage noted.  On the distal phalanx, lateral aspect there is another dark hard spot where the adjacent toenail rubs.  The fourth toe has what looks like a linear intradermal laceration extending from the MTP to the middle of the middle  phalanx laterally.  There is part of what appears to be a blister that is ruptured and the skin  has fallen off.  There is no obvious drainage from any of the bones but there is an odor emanating.  Culture swab collected and sent to the lab.  Neurological:     General: No focal deficit present.     Mental Status: He is alert and oriented to person, place, and time.  Psychiatric:        Mood and Affect: Mood normal.        Behavior: Behavior normal.        Thought Content: Thought content normal.        Judgment: Judgment normal.      UC Treatments / Results  Labs (all labs ordered are listed, but only abnormal results are displayed) Labs Reviewed  AEROBIC CULTURE (SUPERFICIAL SPECIMEN)  CBG MONITORING, ED    EKG   Radiology No results found.  Procedures Procedures (including critical care time)  Medications Ordered in UC Medications - No data to display  Initial Impression / Assessment and Plan / UC Course  I have reviewed the triage vital signs and the nursing notes.  Pertinent labs & imaging results that were available during my care of the patient were reviewed by me and considered in my medical decision making (see chart for details).   Is here for evaluation of wounds on the second third and fourth toes of his left foot.  He says he first noticed a wounds 2 days ago when he went to take a shower and cut his toenails.  He states that he has not seen any drainage on his socks, noticed any odor, or had any pain.  Patient states he can feel me touching his toes but he has documented peripheral diabetic neuropathy.  Patient last saw his PCP in September, his A1c is 7.1 at that time which was down from 14 in June.  Patient states that he does not go to the doctor regularly, has never seen podiatry, and does not pay a lot of attention to his feet.  I cannot find a documented foot exam in the last clinic note.  Patient did have a diabetic foot exam on June 29 that did not  document any abnormal findings other than peripheral neuropathy.  Patient was advised at the June visit and also the September visit to make an appointment for a diabetic eye exam.  Will check fingerstick in clinic and send swab for culture.  If fingerstick is elevated will check additional labs.  Nursing glucose 151.  Will DC patient home with Bactroban dressing twice daily x10 days.  Patient instructed to follow-up with his primary care provider next week.  Final Clinical Impressions(s) / UC Diagnoses   Final diagnoses:  Open wound of left foot with complication, initial encounter     Discharge Instructions     AngelaYou need to keep the wounds on your left foot clean and dry.  Apply mupirocin ointment and a fresh dressing 3 times a day for the next 10 days.  Wear shoes with a wider toe box to accommodate the dressings and also to alleviate pressure on your toes which could be causing the wounds.  Make sure you are taking your Metformin lisinopril as directed.  You need to see your primary care doctor Monday or Tuesday of next week.  Call when you leave to make an appointment.    ED Prescriptions    Medication Sig Dispense Auth. Provider   mupirocin nasal ointment (BACTROBAN) 2 % Apply to wound 3 times a day. 22 g  Becky Augusta, NP     PDMP not reviewed this encounter.   Becky Augusta, NP 08/16/20 1222

## 2020-08-16 NOTE — Discharge Instructions (Addendum)
AngelaYou need to keep the wounds on your left foot clean and dry.  Apply mupirocin ointment and a fresh dressing 3 times a day for the next 10 days.  Wear shoes with a wider toe box to accommodate the dressings and also to alleviate pressure on your toes which could be causing the wounds.  Make sure you are taking your Metformin lisinopril as directed.  You need to see your primary care doctor Monday or Tuesday of next week.  Call when you leave to make an appointment.

## 2020-08-16 NOTE — ED Triage Notes (Addendum)
Pt is here with open foot pain/spots on his left foot that he noticed 2 days ago, pt's right foot(second toe) nail has come complete off. 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 Pt has not taken any meds to relieve discomfort.

## 2020-08-16 NOTE — ED Notes (Signed)
Dressing applied to his left 2nd, 3rd, and 4th toes. Patient tolerated well. Applied ointment, and non adherent and wrapped in Coban.

## 2020-08-18 LAB — AEROBIC CULTURE W GRAM STAIN (SUPERFICIAL SPECIMEN)

## 2020-08-20 LAB — GLUCOSE, CAPILLARY: Glucose-Capillary: 153 mg/dL — ABNORMAL HIGH (ref 70–99)

## 2020-08-26 ENCOUNTER — Ambulatory Visit: Payer: Self-pay | Admitting: Family Medicine

## 2020-09-24 ENCOUNTER — Ambulatory Visit: Payer: Self-pay | Admitting: Family Medicine

## 2020-11-03 ENCOUNTER — Other Ambulatory Visit: Payer: Self-pay

## 2020-11-03 ENCOUNTER — Encounter: Payer: Self-pay | Admitting: Emergency Medicine

## 2020-11-03 ENCOUNTER — Ambulatory Visit
Admission: EM | Admit: 2020-11-03 | Discharge: 2020-11-03 | Disposition: A | Discharge status: Home / Self Care | Payer: Self-pay | Attending: Physician Assistant | Admitting: Physician Assistant

## 2020-11-03 DIAGNOSIS — L03031 Cellulitis of right toe: Secondary | ICD-10-CM | POA: Insufficient documentation

## 2020-11-03 DIAGNOSIS — S90424A Blister (nonthermal), right lesser toe(s), initial encounter: Secondary | ICD-10-CM | POA: Insufficient documentation

## 2020-11-03 DIAGNOSIS — E118 Type 2 diabetes mellitus with unspecified complications: Secondary | ICD-10-CM | POA: Insufficient documentation

## 2020-11-03 MED ORDER — CEPHALEXIN 500 MG PO CAPS: 1000.0000 mg | ORAL_CAPSULE | Freq: Two times a day (BID) | ORAL | 0 refills | Status: AC

## 2020-11-03 NOTE — Discharge Instructions (Signed)
You have a bacterial infection of your toes of your right foot. Start and complete all antibiotics. I cultured the blister but not sure if any bacteria will grow. We may change antibiotics based on culture, but do not stop them. Keep foot clean and dry..Use soap and water and then apply mupirocin. Talk to PCP about referral to podiatrist. Follow up here, with PCP or ED for any worsening symptoms (increased redness/swelling), pain on walking, fever, red streaking

## 2020-11-03 NOTE — ED Triage Notes (Signed)
Pt c/o right 2nd toe pain. Started about 3 days ago. He states he has an infection in his toes. He has used an antifungal and a cream he was given in the past.

## 2020-11-03 NOTE — ED Provider Notes (Signed)
[facility]    CSN: [id number] Arrival date & time: 11/03/20  1034      History   Chief Complaint Chief Complaint  Patient presents with  . Foot Pain    right    HPI George Colon is a 55 y.o. male presenting for swelling, redness and pain of the second and third digits of the right foot. He says he has a very large blister of his second digit. Has been treating with antifungal food spray and mupirocin ointment w/o relief. Patient is diabetic and admits to neuropathy of his feet  does not have a podiatrist. He says his toes rub together a lot and he has to wear toe sleeves. He denies and fever or fatigue. He is otherwise feeling well. Not taking any medication for pain. No other concerns today.  HPI  Past Medical History:  Diagnosis Date  . Anxiety   . Depression   . Diabetes mellitus without complication (HCC)   . Hypertension     Patient Active Problem List   Diagnosis Date Noted  . Erectile dysfunction 06/21/2020  . Low testosterone in male 06/21/2020  . Decreased pedal pulses 03/05/2018  . Uncontrolled diabetes mellitus with diabetic neuropathy (HCC) 07/02/2015  . HTN (hypertension) 07/02/2015  . Atrial fibrillation (HCC) 07/02/2015    History reviewed. No pertinent surgical history.     Home Medications    Prior to Admission medications   Medication Sig Start Date End Date Taking? Authorizing Provider  cephALEXin (KEFLEX) 500 MG capsule Take 2 capsules (1,000 mg total) by mouth 2 (two) times daily for 10 days. 11/03/20 11/13/20 Yes Eusebio Friendly B, PA-C  lisinopril (ZESTRIL) 10 MG tablet Take 2 tablets (20 mg total) by mouth daily. 06/21/20  Yes Malfi, Jodelle Gross, FNP  aspirin 325 MG tablet Take 325 mg by mouth once as needed.     [provider]  metFORMIN (GLUCOPHAGE) 500 MG tablet Take 1 tablet (500 mg total) by mouth daily with breakfast for 7 days, THEN 1 tablet (500 mg total) 2 (two) times daily with a meal for 7 days, THEN 2 tablets  (1,000 mg total) 2 (two) times daily with a meal. Patient not taking: Reported on 05/28/2020 03/26/20 07/08/20  Tarri Fuller, FNP  mupirocin nasal ointment (BACTROBAN) 2 % Apply to wound 3 times a day. 08/16/20   Becky Augusta, NP  RELION LANCETS STANDARD 21G MISC 1 Device by Does not apply route daily. Patient not taking: Reported on 06/21/2020 08/01/18   Galen Manila, NP  tadalafil (CIALIS) 10 MG tablet Take 1 tablet (10 mg total) by mouth daily as needed for erectile dysfunction. 08/13/20   Malfi, Jodelle Gross, FNP    Family History Family History  Problem Relation Age of Onset  . Heart disease Mother   . Liver cancer Father     Social History Social History   Tobacco Use  . Smoking status: Never Smoker  . Smokeless tobacco: Never Used  Vaping Use  . Vaping Use: Never used  Substance Use Topics  . Alcohol use: No  . Drug use: No     Allergies   Patient has no known allergies.   Review of Systems Review of Systems  Constitutional: Negative for fatigue and fever.  Musculoskeletal: Positive for arthralgias. Negative for gait problem and joint swelling.  Skin: Positive for color change. Negative for rash and wound.  Neurological: Negative for weakness and numbness.     Physical Exam Triage Vital  Signs ED Triage Vitals  Enc Vitals Group     BP 11/03/20 1048 (!) 147/96     Pulse Rate 11/03/20 1048 69     Resp 11/03/20 1048 18     Temp 11/03/20 1048 98.1 F (36.7 C)     Temp Source 11/03/20 1048 Oral     SpO2 11/03/20 1048 100 %     Weight 11/03/20 1046 265 lb 14 oz (120.6 kg)     Height 11/03/20 1046 6' (1.829 m)     Head Circumference --      Peak Flow --      Pain Score 11/03/20 1046 0     Pain Loc --      Pain Edu? --      Excl. in GC? --    No data found.  Updated Vital Signs BP (!) 147/96 (BP Location: Right Arm)   Pulse 69   Temp 98.1 F (36.7 C) (Oral)   Resp 18   Ht 6' (1.829 m)   Wt 265 lb 14 oz (120.6 kg)   SpO2 100%   BMI 36.06 kg/m         Physical Exam Vitals and nursing note reviewed.  Constitutional:      General: He is not in acute distress.    Appearance: Normal appearance. He is well-developed and well-nourished. He is obese. He is not ill-appearing.  HENT:     Head: Normocephalic and atraumatic.  Eyes:     General: No scleral icterus.    Conjunctiva/sclera: Conjunctivae normal.  Cardiovascular:     Rate and Rhythm: Normal rate and regular rhythm.     Pulses: Normal pulses.  Pulmonary:     Effort: Pulmonary effort is normal. No respiratory distress.  Musculoskeletal:        General: No edema.     Cervical back: Neck supple.  Skin:    General: Skin is warm and dry.     Comments: Right foot: There is erythema and swelling of the right second and third digits.  There is a large blister of the distal right second digit.  He does not really have any tenderness in these areas.  Decreased sensation of entire foot secondary to diabetic neuropathy.  Fungal toenail disease noted as well.  Neurological:     General: No focal deficit present.     Mental Status: He is alert. Mental status is at baseline.     Motor: No weakness.     Gait: Gait normal.  Psychiatric:        Mood and Affect: Mood and affect and mood normal.        Behavior: Behavior normal.        Thought Content: Thought content normal.          UC Treatments / Results  Labs (all labs ordered are listed, but only abnormal results are displayed) Labs Reviewed  AEROBIC/ANAEROBIC CULTURE (SURGICAL/DEEP WOUND)    EKG   Radiology No results found.  Procedures Incision and Drainage  Date/Time: 11/03/2020 12:02 PM Performed by: Shirlee Latch, PA-C Authorized by: Shirlee Latch, PA-C   Consent:    Consent obtained:  Verbal   Consent given by:  Patient   Risks discussed:  Bleeding, infection, incomplete drainage and pain   Alternatives discussed:  Alternative treatment, delayed treatment and observation Universal protocol:     Procedure explained and questions answered to patient or proxy's satisfaction: yes     Patient identity confirmed:  Verbally with  patient and arm band Location:    Type:  Fluid collection   Size:  2 cm   Location:  Lower extremity   Lower extremity location:  Toe   Toe location:  R second toe Pre-procedure details:    Skin preparation:  Chlorhexidine with alcohol Sedation:    Sedation type:  None Anesthesia:    Anesthesia method:  None Procedure type:    Complexity:  Simple Procedure details:    Incision types:  Single straight   Incision depth:  Dermal   Drainage:  Serous   Drainage amount:  Moderate   Wound treatment:  Wound left open   Packing materials:  None Post-procedure details:    Procedure completion:  Tolerated well, no immediate complications Comments:     Used 18-gauge needle.  Foot was soaked in chlorhexidine and warm water before procedure and then cleaned with alcohol.  After procedure applied nonstick bandage and Coban.  Patient tolerated well.   (including critical care time)  Medications Ordered in UC Medications - No data to display  Initial Impression / Assessment and Plan / UC Course  I have reviewed the triage vital signs and the nursing notes.  Pertinent labs & imaging results that were available during my care of the patient were reviewed by me and considered in my medical decision making (see chart for details).   Patient's exam consistent with cellulitis of the second and third digits.  He also had a large blister due to rubbing of his toes.  Also has fungal toenail disease.  I was able to successfully drain the fluid from the blister.  Treating his cellulitis with Keflex at this time.  I did culture the fluid from the blister, but advised patient it may or may not grow any bacteria.  Advised him to continue antibiotics in either case.  Advised him to follow-up with his PCP in the next few days for recheck.  If he cannot follow-up PCP should return to  our office.  Advised based on culture result or if he is not responding to the Keflex may change his antibiotic.  I did advise to stick to a good diet and to make sure that his diabetes is under control.  Advised for him to speak with his PCP about getting referred to a podiatrist.  ED precautions reviewed patient.   Final Clinical Impressions(s) / UC Diagnoses   Final diagnoses:  Cellulitis of toe of right foot  Blister of toe of right foot, initial encounter  Diabetic foot [facility])     Discharge Instructions     You have a bacterial infection of your toes of your right foot. Start and complete all antibiotics. I cultured the blister but not sure if any bacteria will grow. We may change antibiotics based on culture, but do not stop them. Keep foot clean and dry..Use soap and water and then apply mupirocin. Talk to PCP about referral to podiatrist. Follow up here, with PCP or ED for any worsening symptoms (increased redness/swelling), pain on walking, fever, red streaking    ED Prescriptions    Medication Sig Dispense Auth. Provider   cephALEXin (KEFLEX) 500 MG capsule Take 2 capsules (1,000 mg total) by mouth 2 (two) times daily for 10 days. 40 capsule Shirlee Latch, PA-C     PDMP not reviewed this encounter.   Shirlee Latch, PA-C 11/03/20 1207

## 2020-11-06 ENCOUNTER — Telehealth: Payer: Self-pay | Admitting: Physician Assistant

## 2020-11-06 DIAGNOSIS — L03031 Cellulitis of right toe: Secondary | ICD-10-CM

## 2020-11-06 MED ORDER — DOXYCYCLINE HYCLATE 100 MG PO CAPS: 100.0000 mg | ORAL_CAPSULE | Freq: Two times a day (BID) | ORAL | 0 refills | Status: AC

## 2020-11-06 NOTE — Telephone Encounter (Signed)
Patient was seen in the clinic 3 days ago for cellulitis of the toes the right foot.  I called to check on patient.  He was placed on Keflex.  Patient states his foot does not look any better or worse.  Patient is a diabetic and does have peripheral neuropathy.  Since patient is not experiencing improvement after 3 days, sent doxycycline to pharmacy.  We did obtain a culture of fluid from a blister of the toe.  This did not grow any bacteria.  Suspect he may have MRSA which is not susceptible to the Keflex.  Advised to start doxycycline at this time.  Advised patient that if he is not starting to improve over the next couple of days or if something gets worse he needs to be seen again.  Advised he may need x-rays to see if he has any worsening of the infection.  We did discuss possibility of osteomyelitis which is a concern since he does have peripheral neuropathy and is a diabetic.  Patient states that he may not have enough money to be seen again soon.  He also says that he has to wait a couple days before picking up the doxycycline.  Advised him to go to ED for any acute worsening symptoms such as fever, pain or if the foot looks worse.  Patient agreeable.

## 2020-11-08 LAB — AEROBIC/ANAEROBIC CULTURE W GRAM STAIN (SURGICAL/DEEP WOUND)
Culture: NO GROWTH
Gram Stain: NONE SEEN

## 2020-11-19 ENCOUNTER — Other Ambulatory Visit: Payer: Self-pay

## 2020-11-19 ENCOUNTER — Ambulatory Visit (INDEPENDENT_AMBULATORY_CARE_PROVIDER_SITE_OTHER): Payer: Self-pay | Admitting: Unknown Physician Specialty

## 2020-11-19 ENCOUNTER — Encounter: Payer: Self-pay | Admitting: Unknown Physician Specialty

## 2020-11-19 VITALS — BP 131/78 | HR 65 | Temp 97.5°F | Ht 72.0 in | Wt 257.6 lb

## 2020-11-19 DIAGNOSIS — N529 Male erectile dysfunction, unspecified: Secondary | ICD-10-CM

## 2020-11-19 DIAGNOSIS — E1165 Type 2 diabetes mellitus with hyperglycemia: Secondary | ICD-10-CM

## 2020-11-19 DIAGNOSIS — R7989 Other specified abnormal findings of blood chemistry: Secondary | ICD-10-CM

## 2020-11-19 DIAGNOSIS — IMO0002 Reserved for concepts with insufficient information to code with codable children: Secondary | ICD-10-CM

## 2020-11-19 DIAGNOSIS — I739 Peripheral vascular disease, unspecified: Secondary | ICD-10-CM

## 2020-11-19 DIAGNOSIS — E1140 Type 2 diabetes mellitus with diabetic neuropathy, unspecified: Secondary | ICD-10-CM

## 2020-11-19 LAB — POCT GLYCOSYLATED HEMOGLOBIN (HGB A1C): Hemoglobin A1C: 7.4 % — AB (ref 4.0–5.6)

## 2020-11-19 MED ORDER — METFORMIN HCL 500 MG PO TABS: 500.0000 mg | ORAL_TABLET | Freq: Two times a day (BID) | ORAL | 3 refills | Status: DC

## 2020-11-19 MED ORDER — TADALAFIL 10 MG PO TABS
10.0000 mg | ORAL_TABLET | Freq: Every day | ORAL | 5 refills | Status: DC | PRN
Start: 2020-11-19 — End: 2021-09-26

## 2020-11-19 MED ORDER — LISINOPRIL-HYDROCHLOROTHIAZIDE 20-12.5 MG PO TABS: 1.0000 | ORAL_TABLET | Freq: Every day | ORAL | 1 refills | Status: DC

## 2020-11-19 NOTE — Assessment & Plan Note (Signed)
Will recheck testosterone in 3 months.

## 2020-11-19 NOTE — Assessment & Plan Note (Addendum)
A1C rechecked today and is 7.4%. Patient to restart Metformin and continue to make lifestyle changes of diet and exercise including low-carb and strength training. Will f/u in 3 months.

## 2020-11-19 NOTE — Assessment & Plan Note (Signed)
Refill of Cialis 10 mg.

## 2020-11-19 NOTE — Progress Notes (Signed)
BP 131/78 (BP Location: Left Arm, Patient Position: Sitting, Cuff Size: Normal)   Pulse 65   Temp (!) 97.5 F (36.4 C) (Temporal)   Ht 6' (1.829 m)   Wt 257 lb 9.6 oz (116.8 kg)   BMI 34.94 kg/m    Subjective:    Patient ID: George Colon, male    DOB: Oct 31, 1965, 55 y.o.   MRN: [id number]  HPI: George Colon is a 55 y.o. male  Chief Complaint  Patient presents with  . Toe Injury    second  digits necrotic of the right foot x 18 days. The pt was seen at the urgent care diagnose with cellulitis and treated with antibiotic   Toe Injury Patient states he lost the toenail on his RT foot about 6 months ago and a blister came up in January. Patient cannot remember any trauma to the foot. Denies fever, pain. History of neuropathy. Patient visited Urgent Care on 2/6 where they drained the blister and prescribed antibiotics (Keflex and Doxycycline later). Culture was taken with no growth seen.   Diabetes Patient not checking blood sugars. A1C rechecked today and is at 7.4%. Patient also stated that he has stopped taking his Metformin and is wanting a more natural way of managing his blood sugar. Patient is following a low-carb, high protein diet and has started incorporating L-Arginine 500mg  and Pre-work about 1 month ago. Patient is also going to the gym and focusing on strength training 2-3 times a week.   Low Testosterone/ Erectile dysfunction Concerns about low testosterone from Oncology appt. Asking for refill on Cialis.   Relevant past medical, surgical, family and social history reviewed and updated as indicated. Interim medical history since our last visit reviewed. Allergies and medications reviewed and updated.  Review of Systems  Constitutional: Negative for chills and fever.  Respiratory: Negative for chest tightness and shortness of breath.   Cardiovascular: Negative for chest pain, palpitations and leg swelling.  Skin: Positive for color change and wound.        Blackened area on second toe on Right foot.   Neurological: Positive for numbness.       Numbness to bilateral feet.    Per HPI unless specifically indicated above        Objective:    BP 131/78 (BP Location: Left Arm, Patient Position: Sitting, Cuff Size: Normal)   Pulse 65   Temp (!) 97.5 F (36.4 C) (Temporal)   Ht 6' (1.829 m)   Wt 257 lb 9.6 oz (116.8 kg)   BMI 34.94 kg/m   Wt Readings from Last 3 Encounters:  11/19/20 257 lb 9.6 oz (116.8 kg)  11/03/20 265 lb 14 oz (120.6 kg)  06/21/20 265 lb 12.8 oz (120.6 kg)    Physical Exam Vitals reviewed.  Constitutional:      General: He is not in acute distress. HENT:     Head: Normocephalic.  Cardiovascular:     Rate and Rhythm: Normal rate and regular rhythm.     Pulses:          Dorsalis pedis pulses are 1+ on the right side and 1+ on the left side.     Heart sounds: Normal heart sounds.  Pulmonary:     Effort: Pulmonary effort is normal.     Breath sounds: Normal breath sounds.  Musculoskeletal:       Feet:  Skin:    General: Skin is dry.     Comments: Necrotic tissue on  tip of second toe on Right foot. Blackened.   Neurological:     Mental Status: He is alert and oriented to person, place, and time.  Psychiatric:        Mood and Affect: Mood normal.        Behavior: Behavior normal.     Results for orders placed or performed in visit on 11/19/20  POCT glycosylated hemoglobin (Hb A1C)  Result Value Ref Range   Hemoglobin A1C 7.4 (A) 4.0 - 5.6 %   HbA1c POC (<> result, manual entry)     HbA1c, POC (prediabetic range)     HbA1c, POC (controlled diabetic range)        Assessment & Plan:   Problem List Items Addressed This Visit      Endocrine   Uncontrolled diabetes mellitus with diabetic neuropathy (HCC) - Primary    A1C rechecked today and is 7.4%. Patient to restart Metformin and continue to make lifestyle changes of diet and exercise including low-carb and strength training. Will f/u in 3 months.        Relevant Medications   metFORMIN (GLUCOPHAGE) 500 MG tablet   lisinopril-hydrochlorothiazide (ZESTORETIC) 20-12.5 MG tablet   Other Relevant Orders   POCT glycosylated hemoglobin (Hb A1C) (Completed)   Ambulatory referral to Podiatry     Other   Erectile dysfunction    Refill of Cialis 10 mg.      Relevant Medications   tadalafil (CIALIS) 10 MG tablet   Low testosterone in male    Will recheck testosterone in 3 months.        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 Referral to Podiatry. Discussed foot care and importance of proper footwear.    Relevant Medications   lisinopril-hydrochlorothiazide (ZESTORETIC) 20-12.5 MG tablet   tadalafil (CIALIS) 10 MG tablet   Other Relevant Orders   Ambulatory referral to Podiatry       Follow up plan: Return in about 3 months (around 02/16/2021).

## 2020-11-27 ENCOUNTER — Other Ambulatory Visit: Payer: Self-pay

## 2020-11-27 ENCOUNTER — Telehealth: Payer: Self-pay

## 2020-11-27 ENCOUNTER — Ambulatory Visit: Payer: Self-pay | Admitting: Podiatry

## 2020-11-27 ENCOUNTER — Emergency Department
Admission: EM | Admit: 2020-11-27 | Discharge: 2020-11-27 | Disposition: A | Discharge status: Home / Self Care | Payer: Self-pay | Attending: Emergency Medicine | Admitting: Emergency Medicine

## 2020-11-27 ENCOUNTER — Emergency Department: Payer: Self-pay

## 2020-11-27 ENCOUNTER — Ambulatory Visit: Admission: EM | Admit: 2020-11-27 | Discharge: 2020-11-27 | Payer: Self-pay

## 2020-11-27 DIAGNOSIS — Z79899 Other long term (current) drug therapy: Secondary | ICD-10-CM | POA: Insufficient documentation

## 2020-11-27 DIAGNOSIS — E1140 Type 2 diabetes mellitus with diabetic neuropathy, unspecified: Secondary | ICD-10-CM | POA: Insufficient documentation

## 2020-11-27 DIAGNOSIS — I10 Essential (primary) hypertension: Secondary | ICD-10-CM | POA: Insufficient documentation

## 2020-11-27 DIAGNOSIS — Z7984 Long term (current) use of oral hypoglycemic drugs: Secondary | ICD-10-CM | POA: Insufficient documentation

## 2020-11-27 DIAGNOSIS — L03031 Cellulitis of right toe: Secondary | ICD-10-CM

## 2020-11-27 DIAGNOSIS — I96 Gangrene, not elsewhere classified: Secondary | ICD-10-CM

## 2020-11-27 DIAGNOSIS — R234 Changes in skin texture: Secondary | ICD-10-CM | POA: Insufficient documentation

## 2020-11-27 LAB — COMPREHENSIVE METABOLIC PANEL
ALT: 18 U/L (ref 0–44)
AST: 20 U/L (ref 15–41)
Albumin: 4.4 g/dL (ref 3.5–5.0)
Alkaline Phosphatase: 59 U/L (ref 38–126)
Anion gap: 6 (ref 5–15)
BUN: 10 mg/dL (ref 6–20)
CO2: 28 mmol/L (ref 22–32)
Calcium: 9.2 mg/dL (ref 8.9–10.3)
Chloride: 104 mmol/L (ref 98–111)
Creatinine, Ser: 0.71 mg/dL (ref 0.61–1.24)
GFR, Estimated: >60 mL/min (ref >60)
Glucose, Bld: 175 mg/dL — ABNORMAL HIGH (ref 70–99)
Potassium: 4.6 mmol/L (ref 3.5–5.1)
Sodium: 138 mmol/L (ref 135–145)
Total Bilirubin: 0.9 mg/dL (ref 0.3–1.2)
Total Protein: 7.7 g/dL (ref 6.5–8.1)

## 2020-11-27 LAB — CBC WITH DIFFERENTIAL/PLATELET
Abs Immature Granulocytes: 0.02 10*3/uL (ref 0.00–0.07)
Basophils Absolute: 0 10*3/uL (ref 0.0–0.1)
Basophils Relative: 0 %
Eosinophils Absolute: 0 10*3/uL (ref 0.0–0.5)
Eosinophils Relative: 0 %
HCT: 44.2 % (ref 39.0–52.0)
Hemoglobin: 15.4 g/dL (ref 13.0–17.0)
Immature Granulocytes: 0 %
Lymphocytes Relative: 27 %
Lymphs Abs: 1.6 10*3/uL (ref 0.7–4.0)
MCH: 30.7 pg (ref 26.0–34.0)
MCHC: 34.8 g/dL (ref 30.0–36.0)
MCV: 88 fL (ref 80.0–100.0)
Monocytes Absolute: 0.5 10*3/uL (ref 0.1–1.0)
Monocytes Relative: 8 %
Neutro Abs: 3.8 10*3/uL (ref 1.7–7.7)
Neutrophils Relative %: 65 %
Platelets: 139 10*3/uL — ABNORMAL LOW (ref 150–400)
RBC: 5.02 MIL/uL (ref 4.22–5.81)
RDW: 13.2 % (ref 11.5–15.5)
WBC: 6 10*3/uL (ref 4.0–10.5)
nRBC: 0 % (ref 0.0–0.2)

## 2020-11-27 MED ORDER — CEPHALEXIN 500 MG PO CAPS: 500.0000 mg | ORAL_CAPSULE | Freq: Two times a day (BID) | ORAL | 0 refills | Status: DC

## 2020-11-27 NOTE — ED Provider Notes (Signed)
[facility] Emergency Department Provider Note   ____________________________________________    I have reviewed the triage vital signs and the nursing notes.   HISTORY  Chief Complaint Toe discoloration    HPI George Colon is a 55 y.o. male with history of diabetes and hypertension with diabetic neuropathy who presents with discoloration to his right second toe.  Patient reports he developed a blister to the area that a month ago, it was opened at urgent care and drained, was supposed to take antibiotics but did not.  He reports return black along the medial aspect.  No pain.  No fevers chills, no swelling.  No discharge reported  Past Medical History:  Diagnosis Date  . Anxiety   . Depression   . Diabetes mellitus without complication (HCC)   . Hypertension     Patient Active Problem List   Diagnosis Date Noted  . Erectile dysfunction 06/21/2020  . Low testosterone in male 06/21/2020  . Decreased pedal pulses 03/05/2018  . Uncontrolled diabetes mellitus with diabetic neuropathy (HCC) 07/02/2015  . HTN (hypertension) 07/02/2015  . Atrial fibrillation (HCC) 07/02/2015    History reviewed. No pertinent surgical history.  Prior to Admission medications   Medication Sig Start Date End Date Taking? Authorizing Provider  cephALEXin (KEFLEX) 500 MG capsule Take 1 capsule (500 mg total) by mouth 2 (two) times daily. 11/27/20  Yes Jene Every, MD  Arginine 500 MG CAPS Take by mouth.    [provider]  lisinopril (ZESTRIL) 10 MG tablet Take 2 tablets (20 mg total) by mouth daily. Patient not taking: No sig reported 06/21/20   Tarri Fuller, FNP  lisinopril-hydrochlorothiazide (ZESTORETIC) 20-12.5 MG tablet Take 1 tablet by mouth daily. 11/19/20   Gabriel Cirri, NP  metFORMIN (GLUCOPHAGE) 500 MG tablet Take 1 tablet (500 mg total) by mouth 2 (two) times daily with a meal. 11/19/20   Gabriel Cirri, NP  mupirocin nasal ointment  (BACTROBAN) 2 % Apply to wound 3 times a day. 08/16/20   Becky Augusta, NP  RELION LANCETS STANDARD 21G MISC 1 Device by Does not apply route daily. Patient not taking: No sig reported 08/01/18   Galen Manila, NP  tadalafil (CIALIS) 10 MG tablet Take 1 tablet (10 mg total) by mouth daily as needed for erectile dysfunction. 11/19/20   Gabriel Cirri, NP  Zinc 100 MG TABS Take by mouth.    [provider]     Allergies Patient has no known allergies.  Family History  Problem Relation Age of Onset  . Heart disease Mother   . Liver cancer Father   . Heart disease Father   . Hearing loss Maternal Grandmother   . Stroke Paternal Grandmother     Social History Social History   Tobacco Use  . Smoking status: Never Smoker  . Smokeless tobacco: Never Used  Vaping Use  . Vaping Use: Never used  Substance Use Topics  . Alcohol use: No  . Drug use: No    Review of Systems  Constitutional: No fever/chills     Gastrointestinal: No abdominal pain.  No nausea, no vomiting.    Musculoskeletal: As above Skin: As above     ____________________________________________   PHYSICAL EXAM:  VITAL SIGNS: ED Triage Vitals [11/27/20 1032]  Enc Vitals Group     BP 137/90     Pulse Rate 65     Resp 18     Temp 98 F (36.7 C)  Temp src      SpO2 100 %     Weight      Height      Head Circumference      Peak Flow      Pain Score 0     Pain Loc      Pain Edu?      Excl. in GC?     Constitutional: Alert and oriented. No acute distress. Pleasant and interactive Eyes: Conjunctivae are normal.  Head: Atraumatic. Nose: No congestion/rhinnorhea. Mouth/Throat: Mucous membranes are moist.   Cardiovascular: Normal rate, regular rhythm.  Respiratory: Normal respiratory effort.  No retractions.  Musculoskeletal: Right second toe, black eschar along the medial aspect, toe is not swollen, no tenderness, no discharge, no significant erythema pictures on chart, warm  and well-perfused Neurologic:  Normal speech and language. No gross focal neurologic deficits are appreciated.   Skin:  Skin is warm, dry, see above   ____________________________________________   LABS (all labs ordered are listed, but only abnormal results are displayed)  Labs Reviewed  COMPREHENSIVE METABOLIC PANEL - Abnormal; Notable for the following components:      Result Value   Glucose, Bld 175 (*)    All other components within normal limits  CBC WITH DIFFERENTIAL/PLATELET - Abnormal; Notable for the following components:   Platelets 139 (*)    All other components within normal limits  URINALYSIS, COMPLETE (UACMP) WITH MICROSCOPIC   ____________________________________________  EKG   ____________________________________________  RADIOLOGY  X-ray viewed by me, no evidence of osteomyelitis ____________________________________________   PROCEDURES  Procedure(s) performed: No  Procedures   Critical Care performed: No ____________________________________________   INITIAL IMPRESSION / ASSESSMENT AND PLAN / ED COURSE  Pertinent labs & imaging results that were available during my care of the patient were reviewed by me and considered in my medical decision making (see chart for details).  Patient with presentation as above, symptoms ongoing for nearly a month, afebrile, no pain.  Exam is overall reassuring.  Discussed with Dr. Ether Griffins of podiatry who examined images of the toe and agrees no negation for admission, recommends Keflex outpatient follow-up with him.   ____________________________________________   FINAL CLINICAL IMPRESSION(S) / ED DIAGNOSES  Final diagnoses:  Eschar of toe      NEW MEDICATIONS STARTED DURING THIS VISIT:  Discharge Medication List as of 11/27/2020 11:19 AM       Note:  This document was prepared using Dragon voice recognition software and may include unintentional dictation errors.   Jene Every, MD 11/27/20  [phone]

## 2020-11-27 NOTE — ED Triage Notes (Signed)
Pt c/o continued right 2nd toe infection. Pt states area has improved some. He did finish Keflex but did not start Doxycycline. Pt states he is concerned about area. He was referred to podiatry and they would not see him without a large deposit.

## 2020-11-27 NOTE — Telephone Encounter (Signed)
Copied from CRM [id number]. Topic: Referral - Status >> Nov 27, 2020  8:43 AM Crist Infante wrote: Reason for CRM: pt went to [facility] and they wanted $200 for the visit.  So pt did not go to appt.  Pt wants to know what other options he has. Pt would like a call back to advise.

## 2020-11-27 NOTE — ED Provider Notes (Signed)
[facility]    CSN: [id number] Arrival date & time: 11/27/20  0901      History   Chief Complaint Chief Complaint  Patient presents with  . Cellulitis    Right 2nd toe    HPI George Colon is a 55 y.o. male.   HPI   Right toe discoloration: Patient reports that about 2 weeks ago he was seen by his [facility] for what appeared to be cellulitis of his right second toe.  He states that this time the distalmost part of his toe was brown and he had significant erythema.  Purulent discharge from the toe was cultured according to patient and he was started on 2 different antibiotics.  He states that he did take his Keflex but unfortunately did not realize that he was also supposed to take doxycycline.  He reports that the pain, redness and discharge has improved however the black discoloration of his toe has progressed slightly.  He states that occasionally it is uncomfortable especially when he stands or walks.  He reports that he was referred to a podiatrist and waited 2 weeks for this appointment however when he arrived for his appointment this morning at 830 he was told that he needed to leave a $200 deposit before he could be seen.  He states that he did not have this amount of money on him and so he presents today for follow-up. No fevers, no changes in ROM of toe, NO trailing erythema.   Past Medical History:  Diagnosis Date  . Anxiety   . Depression   . Diabetes mellitus without complication (HCC)   . Hypertension     Patient Active Problem List   Diagnosis Date Noted  . Erectile dysfunction 06/21/2020  . Low testosterone in male 06/21/2020  . Decreased pedal pulses 03/05/2018  . Uncontrolled diabetes mellitus with diabetic neuropathy (HCC) 07/02/2015  . HTN (hypertension) 07/02/2015  . Atrial fibrillation (HCC) 07/02/2015    History reviewed. No pertinent surgical history.     Home Medications    Prior to Admission medications    Medication Sig Start Date End Date Taking? Authorizing Provider  Arginine 500 MG CAPS Take by mouth.   Yes [provider]  lisinopril-hydrochlorothiazide (ZESTORETIC) 20-12.5 MG tablet Take 1 tablet by mouth daily. 11/19/20  Yes Gabriel Cirri, NP  tadalafil (CIALIS) 10 MG tablet Take 1 tablet (10 mg total) by mouth daily as needed for erectile dysfunction. 11/19/20  Yes Gabriel Cirri, NP  Zinc 100 MG TABS Take by mouth.   Yes [provider]  cephALEXin (KEFLEX) 500 MG capsule Take 500 mg by mouth in the morning and at bedtime.    [provider]  lisinopril (ZESTRIL) 10 MG tablet Take 2 tablets (20 mg total) by mouth daily. Patient not taking: No sig reported 06/21/20   Tarri Fuller, FNP  metFORMIN (GLUCOPHAGE) 500 MG tablet Take 1 tablet (500 mg total) by mouth 2 (two) times daily with a meal. 11/19/20   Gabriel Cirri, NP  mupirocin nasal ointment (BACTROBAN) 2 % Apply to wound 3 times a day. 08/16/20   Becky Augusta, NP  RELION LANCETS STANDARD 21G MISC 1 Device by Does not apply route daily. Patient not taking: No sig reported 08/01/18   Galen Manila, NP    Family History Family History  Problem Relation Age of Onset  . Heart disease Mother   . Liver cancer Father   . Heart disease Father   .  Hearing loss Maternal Grandmother   . Stroke Paternal Grandmother     Social History Social History   Tobacco Use  . Smoking status: Never Smoker  . Smokeless tobacco: Never Used  Vaping Use  . Vaping Use: Never used  Substance Use Topics  . Alcohol use: No  . Drug use: No     Allergies   Patient has no known allergies.   Review of Systems Review of Systems  As stated above in HPI Physical Exam Triage Vital Signs ED Triage Vitals  Enc Vitals Group     BP 11/27/20 0936 (!) 130/95     Pulse Rate 11/27/20 0936 66     Resp 11/27/20 0936 18     Temp 11/27/20 0936 97.6 F (36.4 C)     Temp Source 11/27/20 0936 Oral     SpO2 11/27/20  0936 100 %     Weight 11/27/20 0934 257 lb (116.6 kg)     Height 11/27/20 0934 6' (1.829 m)     Head Circumference --      Peak Flow --      Pain Score 11/27/20 0934 0     Pain Loc --      Pain Edu? --      Excl. in GC? --    No data found.  Updated Vital Signs BP (!) 130/95 (BP Location: Left Arm)   Pulse 66   Temp 97.6 F (36.4 C) (Oral)   Resp 18   Ht 6' (1.829 m)   Wt 257 lb (116.6 kg)   SpO2 100%   BMI 34.86 kg/m   Physical Exam Vitals and nursing note reviewed.  Constitutional:      General: He is not in acute distress.    Appearance: Normal appearance. He is obese. He is not ill-appearing, toxic-appearing or diaphoretic.  Cardiovascular:     Pulses: Normal pulses.  Skin:    Comments: The distal aspect of the right 2nd digit is gangrenous without any sign of erythema, discharge or ROM reduction  Neurological:     Mental Status: He is alert.      UC Treatments / Results  Labs (all labs ordered are listed, but only abnormal results are displayed) Labs Reviewed - No data to display  EKG   Radiology No results found.  Procedures Procedures (including critical care time)  Medications Ordered in UC Medications - No data to display  Initial Impression / Assessment and Plan / UC Course  I have reviewed the triage vital signs and the nursing notes.  Pertinent labs & imaging results that were available during my care of the patient were reviewed by me and considered in my medical decision making (see chart for details).     New to me.  I discussed my concerns with patient. He wishes to avoid care at [facility] given financial concerns and lack of positive experience with charity care programs here per pt. I have suggested [facility] orthopedic urgent care given their charity care programs and patient preference however he prefers to go to the emergency room upon further discussion.  We did discuss the importance of seeking medical care today.  Final Clinical  Impressions(s) / UC Diagnoses   Final diagnoses:  None   Discharge Instructions   None    ED Prescriptions    None     PDMP not reviewed this encounter.   Rushie Chestnut, [place] 11/27/20 1000

## 2020-11-27 NOTE — ED Triage Notes (Addendum)
Pt comes from [facility] with c/o right second toe infection. Pt is a diabetic. Pt states he noticed it couple weeks ago. Pt states he waited trying to get in with his PCP but not able to.  Pt states he was then advised to come here for full workup. Per [facility] pt has cellulitis and gangrene. Pt did take first prescribed prescription.   Pt has black discoloration to right 2nd toe.

## 2020-11-27 NOTE — ED Notes (Addendum)
Only green and lav sent per MD Kinner at this time.

## 2020-11-27 NOTE — ED Notes (Signed)
NAD noted at time of D/C. Pt denies questions or concerns. Pt ambulatory to the lobby at this time. Verbal consent for D/C obtained at this time.  

## 2020-11-27 NOTE — Discharge Instructions (Addendum)
Please go to the emergency room today or an outpatient orthopedic urgent care associated with [facility] or [facility]

## 2021-06-10 ENCOUNTER — Telehealth: Payer: Self-pay

## 2021-06-10 ENCOUNTER — Ambulatory Visit: Payer: Self-pay | Admitting: Unknown Physician Specialty

## 2021-06-10 NOTE — Telephone Encounter (Signed)
Pt.is requesting a refill on BP medication  called in [facility] [place]

## 2021-06-11 ENCOUNTER — Other Ambulatory Visit: Payer: Self-pay

## 2021-06-11 DIAGNOSIS — I10 Essential (primary) hypertension: Secondary | ICD-10-CM

## 2021-06-17 ENCOUNTER — Encounter: Payer: Self-pay | Admitting: Internal Medicine

## 2021-06-17 ENCOUNTER — Other Ambulatory Visit: Payer: Self-pay

## 2021-06-17 ENCOUNTER — Ambulatory Visit: Payer: Self-pay | Admitting: Internal Medicine

## 2021-06-17 VITALS — BP 127/82 | HR 65 | Temp 97.9°F | Resp 20 | Ht 72.0 in | Wt 269.2 lb

## 2021-06-17 DIAGNOSIS — N521 Erectile dysfunction due to diseases classified elsewhere: Secondary | ICD-10-CM

## 2021-06-17 DIAGNOSIS — E7800 Pure hypercholesterolemia, unspecified: Secondary | ICD-10-CM

## 2021-06-17 DIAGNOSIS — E1165 Type 2 diabetes mellitus with hyperglycemia: Secondary | ICD-10-CM

## 2021-06-17 DIAGNOSIS — IMO0002 Reserved for concepts with insufficient information to code with codable children: Secondary | ICD-10-CM

## 2021-06-17 DIAGNOSIS — E1140 Type 2 diabetes mellitus with diabetic neuropathy, unspecified: Secondary | ICD-10-CM

## 2021-06-17 DIAGNOSIS — E6609 Other obesity due to excess calories: Secondary | ICD-10-CM | POA: Insufficient documentation

## 2021-06-17 DIAGNOSIS — Z6836 Body mass index (BMI) 36.0-36.9, adult: Secondary | ICD-10-CM

## 2021-06-17 DIAGNOSIS — E66812 Obesity, class 2: Secondary | ICD-10-CM | POA: Insufficient documentation

## 2021-06-17 DIAGNOSIS — I10 Essential (primary) hypertension: Secondary | ICD-10-CM

## 2021-06-17 LAB — POCT GLYCOSYLATED HEMOGLOBIN (HGB A1C): Hemoglobin A1C: 8.2 % — AB (ref 4.0–5.6)

## 2021-06-17 MED ORDER — LISINOPRIL 10 MG PO TABS: 10.0000 mg | ORAL_TABLET | Freq: Two times a day (BID) | ORAL | 2 refills | Status: DC

## 2021-06-17 NOTE — Patient Instructions (Signed)
Carbohydrate Counting for Diabetes Mellitus, Adult Carbohydrate counting is a method of keeping track of how many carbohydrates you eat. Eating carbohydrates naturally increases the amount of sugar (glucose) in the blood. Counting how many carbohydrates you eat improves your blood glucose control, which helps you manage your diabetes. It is important to know how many carbohydrates you can safely have in each meal. This is different for every person. A dietitian can help you make a meal plan and calculate how many carbohydrates you should have at each meal and snack. What foods contain carbohydrates? Carbohydrates are found in the following foods: Grains, such as breads and cereals. Dried beans and soy products. Starchy vegetables, such as potatoes, peas, and corn. Fruit and fruit juices. Milk and yogurt. Sweets and snack foods, such as cake, cookies, candy, chips, and soft drinks. How do I count carbohydrates in foods? There are two ways to count carbohydrates in food. You can read food labels or learn standard serving sizes of foods. You can use either of the methods or a combination of both. Using the Nutrition Facts label The Nutrition Facts list is included on the labels of almost all packaged foods and beverages in the [place] It includes: The serving size. Information about nutrients in each serving, including the grams (g) of carbohydrate per serving. To use the Nutrition Facts: Decide how many servings you will have. Multiply the number of servings by the number of carbohydrates per serving. The resulting number is the total amount of carbohydrates that you will be having. Learning the standard serving sizes of foods When you eat carbohydrate foods that are not packaged or do not include Nutrition Facts on the label, you need to measure the servings in order to count the amount of carbohydrates. Measure the foods that you will eat with a food scale or measuring cup, if needed. Decide how  many standard-size servings you will eat. Multiply the number of servings by 15. For foods that contain carbohydrates, one serving equals 15 g of carbohydrates. For example, if you eat 2 cups or 10 oz (300 g) of strawberries, you will have eaten 2 servings and 30 g of carbohydrates (2 servings x 15 g = 30 g). For foods that have more than one food mixed, such as soups and casseroles, you must count the carbohydrates in each food that is included. The following list contains standard serving sizes of common carbohydrate-rich foods. Each of these servings has about 15 g of carbohydrates: 1 slice of bread. 1 six-inch (15 cm) tortilla. ? cup or 2 oz (53 g) cooked rice or pasta.  cup or 3 oz (85 g) cooked or canned, drained and rinsed beans or lentils.  cup or 3 oz (85 g) starchy vegetable, such as peas, corn, or squash.  cup or 4 oz (120 g) hot cereal.  cup or 3 oz (85 g) boiled or mashed potatoes, or  or 3 oz (85 g) of a large baked potato.  cup or 4 fl oz (118 mL) fruit juice. 1 cup or 8 fl oz (237 mL) milk. 1 small or 4 oz (106 g) apple.  or 2 oz (63 g) of a medium banana. 1 cup or 5 oz (150 g) strawberries. 3 cups or 1 oz (24 g) popped popcorn. What is an example of carbohydrate counting? To calculate the number of carbohydrates in this sample meal, follow the steps shown below. Sample meal 3 oz (85 g) chicken breast. ? cup or 4 oz (106 g) brown   rice.  cup or 3 oz (85 g) corn. 1 cup or 8 fl oz (237 mL) milk. 1 cup or 5 oz (150 g) strawberries with sugar-free whipped topping. Carbohydrate calculation Identify the foods that contain carbohydrates: Rice. Corn. Milk. Strawberries. Calculate how many servings you have of each food: 2 servings rice. 1 serving corn. 1 serving milk. 1 serving strawberries. Multiply each number of servings by 15 g: 2 servings rice x 15 g = 30 g. 1 serving corn x 15 g = 15 g. 1 serving milk x 15 g = 15 g. 1 serving strawberries x 15 g = 15  g. Add together all of the amounts to find the total grams of carbohydrates eaten: 30 g + 15 g + 15 g + 15 g = 75 g of carbohydrates total. What are tips for following this plan? Shopping Develop a meal plan and then make a shopping list. Buy fresh and frozen vegetables, fresh and frozen fruit, dairy, eggs, beans, lentils, and whole grains. Look at food labels. Choose foods that have more fiber and less sugar. Avoid processed foods and foods with added sugars. Meal planning Aim to have the same amount of carbohydrates at each meal and for each snack time. Plan to have regular, balanced meals and snacks. Where to find more information American Diabetes Association: www.diabetes.org Centers for Disease Control and Prevention: www.cdc.gov Summary Carbohydrate counting is a method of keeping track of how many carbohydrates you eat. Eating carbohydrates naturally increases the amount of sugar (glucose) in the blood. Counting how many carbohydrates you eat improves your blood glucose control, which helps you manage your diabetes. A dietitian can help you make a meal plan and calculate how many carbohydrates you should have at each meal and snack. This information is not intended to replace advice given to you by your health care provider. Make sure you discuss any questions you have with your health care provider. Document Revised: 09/14/2019 Document Reviewed: 09/15/2019 Elsevier Patient Education  2021 Elsevier Inc.  

## 2021-06-17 NOTE — Assessment & Plan Note (Signed)
He declines lipid profile for financial reasons Encouraged him to consume a low fat diet

## 2021-06-17 NOTE — Assessment & Plan Note (Signed)
Controlled on Lisinopril HCT He wants to wean off this medication RX for Lisinopril 10 mg BID Reinforced DASH diet and exercise for weight loss

## 2021-06-17 NOTE — Assessment & Plan Note (Signed)
Encouraged diet and exercise for weight loss ?

## 2021-06-17 NOTE — Assessment & Plan Note (Signed)
Continue Cialis as needed 

## 2021-06-17 NOTE — Progress Notes (Signed)
Subjective:    Patient ID: George Colon, male    DOB: [birth year], 55 y.o.   MRN: [id number]  HPI  HTN: His BP today is 127/82. He is taking Lisinopril HCT as prescribed but reports he would like to wean off this medication. ECG from 03/2017 reviewed.  DM 2: His last A1C was 7.4%,. He reports he has not taken Metformin in months. He does not check his sugars. He does not check his feet. His last eye exam was more than 1 year ago. Flu never. Pneumovax never. Covid Pfizer x 2.  ED: He is not taking Cialis at this time. He does not follow with urology.   HLD: His last LDL was 113, triglycerides 100, 02/2020. He is not taking any cholesterol lowering medication at this time. He has not been consuming a low fat diet. Review of Systems  Past Medical History:  Diagnosis Date   Anxiety    Depression    Diabetes mellitus without complication (HCC)    Hypertension     Current Outpatient Medications  Medication Sig Dispense Refill   Arginine 500 MG CAPS Take by mouth.     cephALEXin (KEFLEX) 500 MG capsule Take 1 capsule (500 mg total) by mouth 2 (two) times daily. 14 capsule 0   lisinopril (ZESTRIL) 10 MG tablet Take 2 tablets (20 mg total) by mouth daily. (Patient not taking: No sig reported) 180 tablet 3   lisinopril-hydrochlorothiazide (ZESTORETIC) 20-12.5 MG tablet Take 1 tablet by mouth daily. 90 tablet 1   metFORMIN (GLUCOPHAGE) 500 MG tablet Take 1 tablet (500 mg total) by mouth 2 (two) times daily with a meal. 180 tablet 3   mupirocin nasal ointment (BACTROBAN) 2 % Apply to wound 3 times a day. 22 g 0   RELION LANCETS STANDARD 21G MISC 1 Device by Does not apply route daily. (Patient not taking: No sig reported) 100 each 3   tadalafil (CIALIS) 10 MG tablet Take 1 tablet (10 mg total) by mouth daily as needed for erectile dysfunction. 15 tablet 5   Zinc 100 MG TABS Take by mouth.     No current facility-administered medications for this visit.    No Known Allergies  Family  History  Problem Relation Age of Onset   Heart disease Mother    Liver cancer Father    Heart disease Father    Hearing loss Maternal Grandmother    Stroke Paternal Grandmother     Social History   Socioeconomic History   Marital status: Single    Spouse name: Not on file   Number of children: Not on file   Years of education: Not on file   Highest education level: Not on file  Occupational History   Occupation: [employer]  Tobacco Use   Smoking status: Never   Smokeless tobacco: Never  Vaping Use   Vaping Use: Never used  Substance and Sexual Activity   Alcohol use: No   Drug use: No   Sexual activity: Yes    Birth control/protection: None  Other Topics Concern   Not on file  Social History Narrative   Not on file   Social Determinants of Health   Financial Resource Strain: Not on file  Food Insecurity: Not on file  Transportation Needs: Not on file  Physical Activity: Not on file  Stress: Not on file  Social Connections: Not on file  Intimate Partner Violence: Not on file     Constitutional: Denies fever, malaise, fatigue, headache or  abrupt weight changes.  HEENT: Denies eye pain, eye redness, ear pain, ringing in the ears, wax buildup, runny nose, nasal congestion, bloody nose, or sore throat. Respiratory: Denies difficulty breathing, shortness of breath, cough or sputum production.   Cardiovascular: Denies chest pain, chest tightness, palpitations or swelling in the hands or feet.  Skin: Denies redness, rashes, lesions or ulcercations.  Neurological: Denies dizziness, difficulty with memory, difficulty with speech or problems with balance and coordination.    No other specific complaints in a complete review of systems (except as listed in HPI above).     Objective:   BP 127/82 (BP Location: Right Arm, Patient Position: Sitting, Cuff Size: Large)   Pulse 65   Temp 97.9 F (36.6 C) (Temporal)   Resp 20   Ht 6' (1.829 m)   Wt 269 lb 3.2 oz (122.1 kg)    SpO2 99%   BMI 36.51 kg/m   Wt Readings from Last 3 Encounters:  11/27/20 257 lb (116.6 kg)  11/19/20 257 lb 9.6 oz (116.8 kg)  11/03/20 265 lb 14 oz (120.6 kg)    General: Appears his stated age, obese, in NAD. Skin: Warm, dry and intact. No ulcerations noted. Cardiovascular: Normal rate and rhythm. S1,S2 noted.  No murmur, rubs or gallops noted. No JVD or BLE edema. No carotid bruits noted. Pulmonary/Chest: Normal effort and positive vesicular breath sounds. No respiratory distress. No wheezes, rales or ronchi noted.  Neurological: Alert and oriented.    BMET    Component Value Date/Time   NA 138 11/27/2020 1035   K 4.6 11/27/2020 1035   CL 104 11/27/2020 1035   CO2 28 11/27/2020 1035   GLUCOSE 175 (H) 11/27/2020 1035   BUN 10 11/27/2020 1035   CREATININE 0.71 11/27/2020 1035   CREATININE 0.64 (L) 03/27/2020 1118   CALCIUM 9.2 11/27/2020 1035   GFRNONAA >60 11/27/2020 1035   GFRNONAA 112 03/27/2020 1118   GFRAA 130 03/27/2020 1118    Lipid Panel     Component Value Date/Time   CHOL 168 03/27/2020 1118   TRIG 100 03/27/2020 1118   HDL 34 (L) 03/27/2020 1118   CHOLHDL 4.9 03/27/2020 1118   LDLCALC 113 (H) 03/27/2020 1118    CBC    Component Value Date/Time   WBC 6.0 11/27/2020 1035   RBC 5.02 11/27/2020 1035   HGB 15.4 11/27/2020 1035   HCT 44.2 11/27/2020 1035   PLT 139 (L) 11/27/2020 1035   MCV 88.0 11/27/2020 1035   MCH 30.7 11/27/2020 1035   MCHC 34.8 11/27/2020 1035   RDW 13.2 11/27/2020 1035   LYMPHSABS 1.6 11/27/2020 1035   MONOABS 0.5 11/27/2020 1035   EOSABS 0.0 11/27/2020 1035   BASOSABS 0.0 11/27/2020 1035    Hgb A1C Lab Results  Component Value Date   HGBA1C 7.4 (A) 11/19/2020            Assessment & Plan:    Nicki Reaper, NP This visit occurred during the SARS-CoV-2 public health emergency.  Safety protocols were in place, including screening questions prior to the visit, additional usage of staff PPE, and extensive cleaning  of exam room while observing appropriate contact time as indicated for disinfecting solutions.

## 2021-06-17 NOTE — Assessment & Plan Note (Signed)
POCT A1C 8.2% He does not want urine microalbumin for financial reasons Encouraged him to consume a low carb diet and exercise for weight loss He declines to take Metformin He declines eye exam for financial reasons Encouraged routing foot exams He declines flu, pneumovax or covid booster

## 2021-07-15 ENCOUNTER — Telehealth: Payer: Self-pay

## 2021-07-15 MED ORDER — LISINOPRIL-HYDROCHLOROTHIAZIDE 20-25 MG PO TABS: 1.0000 | ORAL_TABLET | Freq: Every day | ORAL | 0 refills | Status: DC

## 2021-07-15 NOTE — Telephone Encounter (Signed)
Pt is requesting to be put back on his water pill ? ... called into  [facility]  [place]  .  Pt call back  # is  [phone]

## 2021-07-15 NOTE — Telephone Encounter (Signed)
RX for Lisinopril HCT sent to pharmacy

## 2021-07-15 NOTE — Addendum Note (Signed)
Addended by: Lorre Munroe on: 07/15/2021 11:58 AM   Modules accepted: Orders

## 2021-09-13 ENCOUNTER — Other Ambulatory Visit: Payer: Self-pay

## 2021-09-13 ENCOUNTER — Encounter: Payer: Self-pay | Admitting: Emergency Medicine

## 2021-09-13 ENCOUNTER — Ambulatory Visit
Admission: EM | Admit: 2021-09-13 | Discharge: 2021-09-13 | Disposition: A | Discharge status: Home / Self Care | Payer: Self-pay | Attending: Emergency Medicine | Admitting: Emergency Medicine

## 2021-09-13 DIAGNOSIS — S025XXB Fracture of tooth (traumatic), initial encounter for open fracture: Secondary | ICD-10-CM

## 2021-09-13 DIAGNOSIS — K047 Periapical abscess without sinus: Secondary | ICD-10-CM

## 2021-09-13 MED ORDER — AMOXICILLIN-POT CLAVULANATE 875-125 MG PO TABS: 1.0000 | ORAL_TABLET | Freq: Two times a day (BID) | ORAL | 0 refills | Status: AC

## 2021-09-13 NOTE — ED Triage Notes (Signed)
Patient states that he has had an infected tooth on the right upper side for the past 2 weeks.

## 2021-09-13 NOTE — ED Provider Notes (Signed)
[facility]    CSN: [id number] Arrival date & time: 09/13/21  1358      History   Chief Complaint Chief Complaint  Patient presents with   Dental Pain    HPI George Colon is a 55 y.o. male.   HPI  55 year old male here for evaluation of dental issues.  Patient reports that he is got a significant number of broken teeth in his mouth and has been ongoing situation for a number of years.  He reports that approximately once a year he will have an increase in pain in various of his remaining teeth fragments that will require antibiotics to resolve.  He typically rinses with Listerine 4-5 times a day and applies lemon juice to the broken teeth to kill bacteria which helps keep his infections at bay.  He has visited a dentist and he is in the process of saving up the money required to have his teeth removed and dentures made.  His current episode of pain has been going on for last 2 to 3 weeks.  He denies any drainage from the teeth, fever, or facial swelling.  Past Medical History:  Diagnosis Date   Anxiety    Depression    Diabetes mellitus without complication (HCC)    Hypertension     Patient Active Problem List   Diagnosis Date Noted   Class 2 obesity due to excess calories with body mass index (BMI) of 36.0 to 36.9 in adult 06/17/2021   Pure hypercholesterolemia 06/17/2021   Erectile dysfunction 06/21/2020   Uncontrolled diabetes mellitus with diabetic neuropathy 07/02/2015   HTN (hypertension) 07/02/2015    History reviewed. No pertinent surgical history.     Home Medications    Prior to Admission medications   Medication Sig Start Date End Date Taking? Authorizing Provider  amoxicillin-clavulanate (AUGMENTIN) 875-125 MG tablet Take 1 tablet by mouth every 12 (twelve) hours for 10 days. 09/13/21 09/23/21 Yes Becky Augusta, NP  lisinopril-hydrochlorothiazide (ZESTORETIC) 20-25 MG tablet Take 1 tablet by mouth daily. 07/15/21  Yes Lorre Munroe,  NP  Zinc 100 MG TABS Take by mouth.   Yes [provider]  tadalafil (CIALIS) 10 MG tablet Take 1 tablet (10 mg total) by mouth daily as needed for erectile dysfunction. Patient not taking: Reported on 06/17/2021 11/19/20   Gabriel Cirri, NP    Family History Family History  Problem Relation Age of Onset   Heart disease Mother    Liver cancer Father    Heart disease Father    Hearing loss Maternal Grandmother    Stroke Paternal Grandmother     Social History Social History   Tobacco Use   Smoking status: Never   Smokeless tobacco: Never  Vaping Use   Vaping Use: Never used  Substance Use Topics   Alcohol use: No   Drug use: No     Allergies   Patient has no known allergies.   Review of Systems Review of Systems  Constitutional:  Negative for fever.  HENT:  Positive for dental problem.     Physical Exam Triage Vital Signs ED Triage Vitals  Enc Vitals Group     BP 09/13/21 1441 134/83     Pulse Rate 09/13/21 1441 68     Resp 09/13/21 1441 15     Temp 09/13/21 1441 97.8 F (36.6 C)     Temp Source 09/13/21 1441 Oral     SpO2 09/13/21 1441 98 %     Weight 09/13/21  1440 270 lb (122.5 kg)     Height 09/13/21 1440 6' (1.829 m)     Head Circumference --      Peak Flow --      Pain Score 09/13/21 1440 4     Pain Loc --      Pain Edu? --      Excl. in GC? --    No data found.  Updated Vital Signs BP 134/83 (BP Location: Left Arm)    Pulse 68    Temp 97.8 F (36.6 C) (Oral)    Resp 15    Ht 6' (1.829 m)    Wt 270 lb (122.5 kg)    SpO2 98%    BMI 36.62 kg/m   Visual Acuity Right Eye Distance:   Left Eye Distance:   Bilateral Distance:    Right Eye Near:   Left Eye Near:    Bilateral Near:     Physical Exam Vitals and nursing note reviewed.  Constitutional:      General: He is not in acute distress.    Appearance: Normal appearance. He is not ill-appearing.  HENT:     Head: Normocephalic and atraumatic.     Mouth/Throat:     Mouth: Mucous  membranes are moist.     Pharynx: Posterior oropharyngeal erythema present. No oropharyngeal exudate.  Skin:    General: Skin is warm and dry.     Capillary Refill: Capillary refill takes less than 2 seconds.  Neurological:     General: No focal deficit present.     Mental Status: He is alert and oriented to person, place, and time.  Psychiatric:        Mood and Affect: Mood normal.        Behavior: Behavior normal.        Thought Content: Thought content normal.        Judgment: Judgment normal.     UC Treatments / Results  Labs (all labs ordered are listed, but only abnormal results are displayed) Labs Reviewed - No data to display  EKG   Radiology No results found.  Procedures Procedures (including critical care time)  Medications Ordered in UC Medications - No data to display  Initial Impression / Assessment and Plan / UC Course  I have reviewed the triage vital signs and the nursing notes.  Pertinent labs & imaging results that were available during my care of the patient were reviewed by me and considered in my medical decision making (see chart for details).  Patient is a nontoxic-appearing 56 year old male here for evaluation of recurrent dental infections.  He is missing multiple teeth and the remainder of his teeth are all broken and in various forms of decay.  Some of them are broken off level at the gumline the others are black stumps.  The surrounding gum tissue both the upper and lower gumline is erythematous.  There is no drainage from any of the teeth.  There is no bogginess to the hard palate or the submental space.  Patient is afebrile.  Patient has consulted a dentist and he is in the process of having his teeth removed and dentures made.  I will place patient on Augmentin twice daily for 10 days for treatment of dental infections.  I have also talked about using [facility] and given him a list of local dental options to pursue should his symptoms  get worse.   Final Clinical Impressions(s) / UC Diagnoses   Final diagnoses:  Dental infection  Open fracture of tooth, initial encounter     Discharge Instructions      Take the Augmentin twice daily with food for 10 days for treatment of your dental infection.  Use over-the-counter Tylenol and ibuprofen for swelling and mild to moderate pain.  Rinse with warm salt water, or Listerine, after each meal to remove food particles and wash away any pus that is collecting.  If you develop any increasing or swelling, fever, pain, or difficulty swallowing you to go to the emergency department at [facility] with a have an oral surgeon and also a dentist on-call.      ED Prescriptions     Medication Sig Dispense Auth. Provider   amoxicillin-clavulanate (AUGMENTIN) 875-125 MG tablet Take 1 tablet by mouth every 12 (twelve) hours for 10 days. 20 tablet Becky Augusta, NP      I have reviewed the PDMP during this encounter.   Becky Augusta, NP 09/13/21 [phone]

## 2021-09-13 NOTE — Discharge Instructions (Signed)
Take the Augmentin twice daily with food for 10 days for treatment of your dental infection.  Use over-the-counter Tylenol and ibuprofen for swelling and mild to moderate pain.  Rinse with warm salt water, or Listerine, after each meal to remove food particles and wash away any pus that is collecting.  If you develop any increasing or swelling, fever, pain, or difficulty swallowing you to go to the emergency department at [facility] with a have an oral surgeon and also a dentist on-call.  

## 2021-09-18 ENCOUNTER — Ambulatory Visit: Payer: Self-pay | Admitting: Internal Medicine

## 2021-09-26 ENCOUNTER — Ambulatory Visit: Payer: Self-pay

## 2021-09-26 ENCOUNTER — Other Ambulatory Visit: Payer: Self-pay | Admitting: Internal Medicine

## 2021-09-26 DIAGNOSIS — N529 Male erectile dysfunction, unspecified: Secondary | ICD-10-CM

## 2021-09-26 MED ORDER — TADALAFIL 10 MG PO TABS: 10.0000 mg | ORAL_TABLET | Freq: Every day | ORAL | 2 refills | Status: DC | PRN

## 2021-09-26 NOTE — Telephone Encounter (Signed)
Medication Refill - Medication: tadalafil (CIALIS) 10 MG tablet  Has the patient contacted their pharmacy? No. Pt stated he has no more refills.  (Agent: If no, request that the patient contact the pharmacy for the refill. If patient does not wish to contact the pharmacy document the reason why and proceed with request.)   Preferred Pharmacy (with phone number or street name):   [facility] [address], [place] - [address]  [address] [place] [address]  Phone: [phone] Fax: [phone]  Hours: Not open 24 hours    Has the patient been seen for an appointment in the last year OR does the patient have an upcoming appointment? Yes.    Agent: Please be advised that RX refills may take up to 3 business days. We ask that you follow-up with your pharmacy.

## 2021-09-26 NOTE — Telephone Encounter (Signed)
Patient wanting a refill of Cialis, this is being done in another encounter.

## 2021-09-26 NOTE — Telephone Encounter (Signed)
Requested medication (s) are due for refill today: yes  Requested medication (s) are on the active medication list: yes  Last refill:  11/19/20 #15 with 5 refills  Future visit scheduled: yess  Notes to clinic:  Please review if refill is appropriate. Last filled by Phineas Inches    Requested Prescriptions  Pending Prescriptions Disp Refills   tadalafil (CIALIS) 10 MG tablet 15 tablet 5    Sig: Take 1 tablet (10 mg total) by mouth daily as needed for erectile dysfunction.     Urology: Erectile Dysfunction Agents Passed - 09/26/2021 12:16 PM      Passed - Last BP in normal range    BP Readings from Last 1 Encounters:  09/13/21 134/83          Passed - Valid encounter within last 12 months    Recent Outpatient Visits           3 months ago Primary hypertension   [facility] [place], [place], NP   10 months ago Uncontrolled diabetes mellitus with diabetic neuropathy [facility])   [facility] Gabriel Cirri, NP   1 year ago Uncontrolled diabetes mellitus with diabetic neuropathy [facility])   [facility], Jodelle Gross, FNP   1 year ago Essential hypertension   [facility], Jodelle Gross, FNP   1 year ago Weight gain   [facility], Jodelle Gross, FNP       Future Appointments             In 1 month Baity, Salvadore Oxford, NP [facility], [facility] [place]

## 2021-10-21 ENCOUNTER — Other Ambulatory Visit: Payer: Self-pay | Admitting: Internal Medicine

## 2021-10-21 NOTE — Telephone Encounter (Signed)
Requested medication (s) are due for refill today: yes  Requested medication (s) are on the active medication list: yes but does not correspond with OV note 06/17/21  Last refill:  06/17/21  Future visit scheduled: no  Notes to clinic:  per OV 06/17/21 lisinopril note to be increased to 10 mg twice daily Overdue lab work   Requested Prescriptions  Pending Prescriptions Disp Refills   lisinopril-hydrochlorothiazide (ZESTORETIC) 20-25 MG tablet [Pharmacy Med Name: Lisinopril-hydroCHLOROthiazide 20-25 MG Oral Tablet] 90 tablet 0    Sig: Take 1 tablet by mouth once daily     Cardiovascular:  ACEI + Diuretic Combos Failed - 10/21/2021  1:32 PM      Failed - Na in normal range and within 180 days    Sodium  Date Value Ref Range Status  11/27/2020 138 135 - 145 mmol/L Final          Failed - K in normal range and within 180 days    Potassium  Date Value Ref Range Status  11/27/2020 4.6 3.5 - 5.1 mmol/L Final          Failed - Cr in normal range and within 180 days    Creat  Date Value Ref Range Status  03/27/2020 0.64 (L) 0.70 - 1.33 mg/dL Final    Comment:    For patients >61 years of age, the reference limit for Creatinine is approximately 13% higher for people identified as African-American. .    Creatinine, Ser  Date Value Ref Range Status  11/27/2020 0.71 0.61 - 1.24 mg/dL Final          Failed - Ca in normal range and within 180 days    Calcium  Date Value Ref Range Status  11/27/2020 9.2 8.9 - 10.3 mg/dL Final          Passed - Patient is not pregnant      Passed - Last BP in normal range    BP Readings from Last 1 Encounters:  09/13/21 134/83          Passed - Valid encounter within last 6 months    Recent Outpatient Visits           4 months ago Primary hypertension   [facility] [place], [place], NP   11 months ago Uncontrolled diabetes mellitus with diabetic neuropathy [facility])   [facility] Gabriel Cirri, NP   1  year ago Uncontrolled diabetes mellitus with diabetic neuropathy [facility])   [facility], Jodelle Gross, FNP   1 year ago Essential hypertension   [facility], Jodelle Gross, FNP   1 year ago Weight gain   [facility], Jodelle Gross, [place]

## 2021-10-23 ENCOUNTER — Other Ambulatory Visit: Payer: Self-pay | Admitting: Internal Medicine

## 2021-10-23 ENCOUNTER — Encounter: Payer: Self-pay | Admitting: Internal Medicine

## 2021-10-23 MED ORDER — LISINOPRIL-HYDROCHLOROTHIAZIDE 20-25 MG PO TABS: 1.0000 | ORAL_TABLET | Freq: Every day | ORAL | 0 refills | Status: DC

## 2021-10-23 NOTE — Telephone Encounter (Signed)
Given your uncontrolled diabetes, it is very important that we see you in the office. Please have him schedule appt.

## 2021-10-29 ENCOUNTER — Ambulatory Visit: Payer: Self-pay | Admitting: Internal Medicine

## 2021-12-24 ENCOUNTER — Other Ambulatory Visit: Payer: Self-pay | Admitting: Internal Medicine

## 2021-12-25 NOTE — Telephone Encounter (Signed)
Requested medication (s) are due for refill today: Yes ? ?Requested medication (s) are on the active medication list: Yes ? ?Last refill:  10/23/21 ? ?Future visit scheduled: No ? ?Notes to clinic:  Unable to refill per protocol due to failed labs, no updated results, needs appointment. ? ? ? ? ? ?Requested Prescriptions  ?Pending Prescriptions Disp Refills  ? lisinopril-hydrochlorothiazide (ZESTORETIC) 20-25 MG tablet [Pharmacy Med Name: Lisinopril-hydroCHLOROthiazide 20-25 MG Oral Tablet] 90 tablet 0  ?  Sig: Take 1 tablet by mouth once daily  ?  ? Cardiovascular:  ACEI + Diuretic Combos Failed - 12/24/2021  9:15 AM  ?  ?  Failed - Na in normal range and within 180 days  ?  Sodium  ?Date Value Ref Range Status  ?11/27/2020 138 135 - 145 mmol/L Final  ?  ?  ?  ?  Failed - K in normal range and within 180 days  ?  Potassium  ?Date Value Ref Range Status  ?11/27/2020 4.6 3.5 - 5.1 mmol/L Final  ?  ?  ?  ?  Failed - Cr in normal range and within 180 days  ?  Creat  ?Date Value Ref Range Status  ?03/27/2020 0.64 (L) 0.70 - 1.33 mg/dL Final  ?  Comment:  ?  For patients >13 years of age, the reference limit ?for Creatinine is approximately 13% higher for people ?identified as African-American. ?. ?  ? ?Creatinine, Ser  ?Date Value Ref Range Status  ?11/27/2020 0.71 0.61 - 1.24 mg/dL Final  ?  ?  ?  ?  Failed - eGFR is 30 or above and within 180 days  ?  GFR, Est African American  ?Date Value Ref Range Status  ?03/27/2020 130 > OR = 60 mL/min/1.79m Final  ? ?GFR, Est Non African American  ?Date Value Ref Range Status  ?03/27/2020 112 > OR = 60 mL/min/1.787mFinal  ? ?GFR, Estimated  ?Date Value Ref Range Status  ?11/27/2020 >60 >60 mL/min Final  ?  Comment:  ?  (NOTE) ?Calculated using the CKD-EPI Creatinine Equation (2021) ?  ?  ?  ?  ?  Failed - Valid encounter within last 6 months  ?  Recent Outpatient Visits   ? ?      ? 6 months ago [facility] hypertension  ? [facility]  ? 1 year ago  Uncontrolled diabetes mellitus with diabetic neuropathy ([facility] ? [facility]  ? 1 year ago Uncontrolled diabetes mellitus with diabetic neuropathy (HCCass ? SoSebewaingFNP  ? 1 year ago Essential hypertension  ? So[place]FNP  ? 1 year ago Weight gain  ? SoJasperFNP  ? ?  ?  ? ?  ?  ?  Passed - Patient is not pregnant  ?  ?  Passed - Last BP in normal range  ?  BP Readings from Last 1 Encounters:  ?09/13/21 134/83  ?  ?  ?  ?  ? ? ? ? ?

## 2021-12-25 NOTE — Telephone Encounter (Signed)
Patient called, no answer, mailbox is full. Patient is due for a CPE if he calls back. MyChart message sent. Routing request to provider for approval. ?

## 2022-01-13 ENCOUNTER — Ambulatory Visit: Payer: Self-pay | Admitting: Internal Medicine

## 2022-01-13 ENCOUNTER — Encounter: Payer: Self-pay | Admitting: Internal Medicine

## 2022-01-13 VITALS — BP 128/76 | HR 75 | Temp 97.6°F | Wt 272.0 lb

## 2022-01-13 DIAGNOSIS — I700 Atherosclerosis of aorta: Secondary | ICD-10-CM | POA: Insufficient documentation

## 2022-01-13 DIAGNOSIS — N521 Erectile dysfunction due to diseases classified elsewhere: Secondary | ICD-10-CM

## 2022-01-13 DIAGNOSIS — E7800 Pure hypercholesterolemia, unspecified: Secondary | ICD-10-CM

## 2022-01-13 DIAGNOSIS — Z6836 Body mass index (BMI) 36.0-36.9, adult: Secondary | ICD-10-CM

## 2022-01-13 DIAGNOSIS — E1165 Type 2 diabetes mellitus with hyperglycemia: Secondary | ICD-10-CM

## 2022-01-13 DIAGNOSIS — I10 Essential (primary) hypertension: Secondary | ICD-10-CM

## 2022-01-13 MED ORDER — LISINOPRIL-HYDROCHLOROTHIAZIDE 10-12.5 MG PO TABS: 1.0000 | ORAL_TABLET | Freq: Every day | ORAL | 1 refills | Status: DC

## 2022-01-13 MED ORDER — ASPIRIN 81 MG PO TBEC: 81.0000 mg | DELAYED_RELEASE_TABLET | Freq: Every day | ORAL | 12 refills | Status: DC

## 2022-01-13 NOTE — Assessment & Plan Note (Signed)
C-Met and lipid profile today ?Encouraged him to consume a low-fat diet ?We will have him start a baby Aspirin daily ?

## 2022-01-13 NOTE — Assessment & Plan Note (Signed)
C-Met and lipid profile today ?Encouraged him to consume a low-fat diet ?

## 2022-01-13 NOTE — Patient Instructions (Signed)

## 2022-01-13 NOTE — Assessment & Plan Note (Signed)
Encourage diet and exercise for weight loss 

## 2022-01-13 NOTE — Assessment & Plan Note (Signed)
Currently not taking any medications for this ?

## 2022-01-13 NOTE — Progress Notes (Signed)
? ?Subjective:  ? ? Patient ID: George Colon, male    DOB: Feb 12, 1966, 56 y.o.   MRN: [id number] ? ?HPI ? ?Patient presents to clinic today for follow-up of chronic conditions. ? ?HTN: His BP today is 128/76.  He is taking Lisinopril HCT as prescribed but he reports he has not taken this in the last 2 days.  ECG from 03/2017 reviewed. ? ?DM2: His last A1c was 8.2%, 05/2021.  He is not taking his Metformin as prescribed.  He does not check his sugars.  He does not check his feet routinely.  His last eye exam was >1-year ago.  Flu never.  Pneumovax never.  COVID [employer]. ? ?ED: He is not currently taking any medication for this at this time.  He does not follow with urology. ? ?HLD: His last LDL was 113, triglycerides 100, 02/2020.  He is not taking any cholesterol-lowering medication at this time.  He does not consume a low-fat diet. ? ?Review of Systems ? ?   ?Past Medical History:  ?Diagnosis Date  ? Anxiety   ? Depression   ? Diabetes mellitus without complication (HCC)   ? Hypertension   ? ? ?Current Outpatient Medications  ?Medication Sig Dispense Refill  ? lisinopril-hydrochlorothiazide (ZESTORETIC) 20-25 MG tablet Take 1 tablet by mouth daily. 90 tablet 0  ? tadalafil (CIALIS) 10 MG tablet Take 1 tablet (10 mg total) by mouth daily as needed for erectile dysfunction. 15 tablet 2  ? Zinc 100 MG TABS Take by mouth.    ? ?No current facility-administered medications for this visit.  ? ? ?No Known Allergies ? ?Family History  ?Problem Relation Age of Onset  ? Heart disease Mother   ? Liver cancer Father   ? Heart disease Father   ? Hearing loss Maternal Grandmother   ? Stroke Paternal Grandmother   ? ? ?Social History  ? ?Socioeconomic History  ? Marital status: Single  ?  Spouse name: Not on file  ? Number of children: Not on file  ? Years of education: Not on file  ? Highest education level: Not on file  ?Occupational History  ? Occupation: [employer]  ?Tobacco Use  ? Smoking status: Never  ? Smokeless  tobacco: Never  ?Vaping Use  ? Vaping Use: Never used  ?Substance and Sexual Activity  ? Alcohol use: No  ? Drug use: No  ? Sexual activity: Yes  ?  Birth control/protection: None  ?Other Topics Concern  ? Not on file  ?Social History Narrative  ? Not on file  ? ?Social Determinants of Health  ? ?Financial Resource Strain: Not on file  ?Food Insecurity: Not on file  ?Transportation Needs: Not on file  ?Physical Activity: Not on file  ?Stress: Not on file  ?Social Connections: Not on file  ?Intimate Partner Violence: Not on file  ? ? ? ?Constitutional: Denies fever, malaise, fatigue, headache or abrupt weight changes.  ?HEENT: Denies eye pain, eye redness, ear pain, ringing in the ears, wax buildup, runny nose, nasal congestion, bloody nose, or sore throat. ?Respiratory: Denies difficulty breathing, shortness of breath, cough or sputum production.   ?Cardiovascular: Denies chest pain, chest tightness, palpitations or swelling in the hands or feet.  ?Gastrointestinal: Denies abdominal pain, bloating, constipation, diarrhea or blood in the stool.  ?GU: Patient reports erectile dysfunction.  Denies urgency, frequency, pain with urination, burning sensation, blood in urine, odor or discharge. ?Musculoskeletal: Pt reports chronic low back pain. Denies decrease in range  of motion, difficulty with gait, muscle pain or joint swelling.  ?Skin: Denies redness, rashes, lesions or ulcercations.  ?Neurological: Pt reports paresthesia of right lip, right hand and right foot. Denies dizziness, difficulty with memory, difficulty with speech or problems with balance and coordination.  ?Psych: Denies anxiety, depression, SI/HI. ? ?No other specific complaints in a complete review of systems (except as listed in HPI above). ? ?Objective:  ? Physical Exam ? ? ?BP 128/76 (BP Location: Left Arm, Patient Position: Sitting, Cuff Size: Large)   Pulse 75   Temp 97.6 ?F (36.4 ?C) (Temporal)   Wt 272 lb (123.4 kg)   SpO2 99%   BMI 36.89  kg/m?  ? ?Wt Readings from Last 3 Encounters:  ?09/13/21 270 lb (122.5 kg)  ?06/17/21 269 lb 3.2 oz (122.1 kg)  ?11/27/20 257 lb (116.6 kg)  ? ? ?General: Appears his stated age, obese, in NAD. ?Skin: Warm, dry and intact.  Multiple skin tags noted of neck.  No ulcerations noted. ?HEENT: Head: normal shape and size; Eyes: sclera white, no icterus, conjunctiva pink, PERRLA and EOMs intact;  ?Cardiovascular: Normal rate and rhythm. S1,S2 noted.  No murmur, rubs or gallops noted.  Trace pitting BLE edema. No carotid bruits noted. ?Pulmonary/Chest: Normal effort and positive vesicular breath sounds. No respiratory distress. No wheezes, rales or ronchi noted.  ?Musculoskeletal: No difficulty with gait.  ?Neurological: Alert and oriented.  ? ?BMET ?   ?Component Value Date/Time  ? NA 138 11/27/2020 1035  ? K 4.6 11/27/2020 1035  ? CL 104 11/27/2020 1035  ? CO2 28 11/27/2020 1035  ? GLUCOSE 175 (H) 11/27/2020 1035  ? BUN 10 11/27/2020 1035  ? CREATININE 0.71 11/27/2020 1035  ? CREATININE 0.64 (L) 03/27/2020 1118  ? CALCIUM 9.2 11/27/2020 1035  ? GFRNONAA >60 11/27/2020 1035  ? GFRNONAA 112 03/27/2020 1118  ? GFRAA 130 03/27/2020 1118  ? ? ?Lipid Panel  ?   ?Component Value Date/Time  ? CHOL 168 03/27/2020 1118  ? TRIG 100 03/27/2020 1118  ? HDL 34 (L) 03/27/2020 1118  ? CHOLHDL 4.9 03/27/2020 1118  ? LDLCALC 113 (H) 03/27/2020 1118  ? ? ?CBC ?   ?Component Value Date/Time  ? WBC 6.0 11/27/2020 1035  ? RBC 5.02 11/27/2020 1035  ? HGB 15.4 11/27/2020 1035  ? HCT 44.2 11/27/2020 1035  ? PLT 139 (L) 11/27/2020 1035  ? MCV 88.0 11/27/2020 1035  ? MCH 30.7 11/27/2020 1035  ? MCHC 34.8 11/27/2020 1035  ? RDW 13.2 11/27/2020 1035  ? LYMPHSABS 1.6 11/27/2020 1035  ? MONOABS 0.5 11/27/2020 1035  ? EOSABS 0.0 11/27/2020 1035  ? BASOSABS 0.0 11/27/2020 1035  ? ? ?Hgb A1C ?Lab Results  ?Component Value Date  ? HGBA1C 8.2 (A) 06/17/2021  ? ? ? ? ? ? ?   ?Assessment & Plan:  ? ? ? ?Nicki Reaper, NP ? ?

## 2022-01-13 NOTE — Assessment & Plan Note (Signed)
A1c and urine microalbumin today ?Encouraged him to consume a low carb diet and exercise for weight loss ?He has declined oral diabetic medication in the past ?He declines routine eye exam due to lack of insurance ?Encourage routine foot exams ?He declines flu or Pneumovax ?Encouraged him to get his COVID booster ?

## 2022-01-13 NOTE — Assessment & Plan Note (Signed)
He would like to decrease Lisinopril HCT to 10-12.5 ?C-Met today ?Reinforced DASH diet and exercise for weight loss ?

## 2022-01-14 LAB — COMPLETE METABOLIC PANEL WITH GFR
AG Ratio: 1.9 (calc) (ref 1.0–2.5)
ALT: 22 U/L (ref 9–46)
AST: 20 U/L (ref 10–35)
Albumin: 3.4 g/dL — ABNORMAL LOW (ref 3.6–5.1)
Alkaline phosphatase (APISO): 45 U/L (ref 35–144)
BUN: 14 mg/dL (ref 7–25)
CO2: 28 mmol/L (ref 20–32)
Calcium: 8.1 mg/dL — ABNORMAL LOW (ref 8.6–10.3)
Chloride: 103 mmol/L (ref 98–110)
Creat: 0.75 mg/dL (ref 0.70–1.30)
Globulin: 1.8 g/dL (calc) — ABNORMAL LOW (ref 1.9–3.7)
Glucose, Bld: 255 mg/dL — ABNORMAL HIGH (ref 65–99)
Potassium: 4.2 mmol/L (ref 3.5–5.3)
Sodium: 138 mmol/L (ref 135–146)
Total Bilirubin: 0.3 mg/dL (ref 0.2–1.2)
Total Protein: 5.2 g/dL — ABNORMAL LOW (ref 6.1–8.1)
eGFR: 107 mL/min/{1.73_m2} (ref >=60)

## 2022-01-14 LAB — HEMOGLOBIN A1C
Hgb A1c MFr Bld: 9.8 % of total Hgb — ABNORMAL HIGH (ref <5.7)
Mean Plasma Glucose: 235 mg/dL
eAG (mmol/L): 13 mmol/L

## 2022-01-16 ENCOUNTER — Telehealth: Payer: Self-pay | Admitting: Internal Medicine

## 2022-01-16 MED ORDER — LISINOPRIL 10 MG PO TABS: 10.0000 mg | ORAL_TABLET | Freq: Every day | ORAL | 1 refills | Status: DC

## 2022-01-16 MED ORDER — HYDROCHLOROTHIAZIDE 12.5 MG PO CAPS: 12.5000 mg | ORAL_CAPSULE | Freq: Every day | ORAL | 1 refills | Status: DC

## 2022-01-16 NOTE — Addendum Note (Signed)
Addended by: Jearld Fenton on: 01/16/2022 11:53 AM ? ? Modules accepted: Orders ? ?

## 2022-01-16 NOTE — Telephone Encounter (Signed)
Copied from [place] [phone]. Topic: General - Other ?>> Jan 16, 2022  8:59 AM Leward Quan A wrote: ?Reason for CRM: Patient called in asking Webb Silversmith if she can please send an Rx to [place], [place]  ?Phone:  [phone]  Fax:  [phone] for the HCTZ separate from the Lisinopril. Please advise and call patient with concerns   Ph# [phone] ?

## 2022-01-16 NOTE — Telephone Encounter (Signed)
Lisinopril 10 mg and HCTZ 2.5 mg sent to pharmacy ?

## 2022-05-02 ENCOUNTER — Ambulatory Visit
Admission: EM | Admit: 2022-05-02 | Discharge: 2022-05-02 | Disposition: A | Discharge status: Home / Self Care | Payer: Self-pay | Attending: Emergency Medicine | Admitting: Emergency Medicine

## 2022-05-02 ENCOUNTER — Encounter: Payer: Self-pay | Admitting: Emergency Medicine

## 2022-05-02 DIAGNOSIS — E11628 Type 2 diabetes mellitus with other skin complications: Secondary | ICD-10-CM

## 2022-05-02 DIAGNOSIS — L03115 Cellulitis of right lower limb: Secondary | ICD-10-CM

## 2022-05-02 DIAGNOSIS — S91131A Puncture wound without foreign body of right great toe without damage to nail, initial encounter: Secondary | ICD-10-CM

## 2022-05-02 DIAGNOSIS — S91139A Puncture wound without foreign body of unspecified toe(s) without damage to nail, initial encounter: Secondary | ICD-10-CM

## 2022-05-02 MED ORDER — DOXYCYCLINE HYCLATE 100 MG PO CAPS
100.0000 mg | ORAL_CAPSULE | Freq: Two times a day (BID) | ORAL | 0 refills | Status: DC
Start: 2022-05-02 — End: 2022-05-12

## 2022-05-02 NOTE — ED Triage Notes (Addendum)
Patient c/o pain and swelling in his right lower leg for 2 days.  Patient denies injury or fall.  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

## 2022-05-02 NOTE — Discharge Instructions (Signed)
Take the Doxycycline twice daily with food for 10 days.  Doxycycline will make you more sensitive to sunburn so wear sunscreen when outdoors and reapply it every 90 minutes.  Keep the wound on your toe clean and dry.  Leave the wound open to air at home and cover with a bandage when at work.  Use OTC Tylenol and Ibuprofen according to the package instructions as needed for pain.  Make a follow-up appointment with your PCP for a wound check in 1 week.  Return for new or worsening symptoms.  If you develop increased redness, swelling, red streaks up your legs, or fever you need to go to the ER.

## 2022-05-02 NOTE — ED Provider Notes (Signed)
[facility]    CSN: [id number] Arrival date & time: 05/02/22  1222      History   Chief Complaint Chief Complaint  Patient presents with   Leg Swelling    right   Leg Pain    HPI George Colon is a 56 y.o. male.   HPI  56 year old male here for evaluation of right leg pain and swelling.  Patient reports that he has been experiencing pain and swelling in his right lower leg for last 2 to 3 days.  He denies any fever, chest pain, or shortness of breath.  He has a history of diabetic neuropathy but he does not indicate any increase in his numbness or tingling in his foot.  He does have significant history of cellulitis to the right foot and toes and also gangrene.  Patient's right great toe does have a Band-Aid on it in the exam room.  After removing the Band-Aid there is a open wound to the toe but there is no drainage.  The surrounding tissue is red with redness tracking up the inside of his right foot to his right ankle.  Past Medical History:  Diagnosis Date   Anxiety    Depression    Diabetes mellitus without complication ([place])    Hypertension     Patient Active Problem List   Diagnosis Date Noted   Aortic atherosclerosis ([place]) 01/13/2022   Class 2 obesity due to excess calories with body mass index (BMI) of 36.0 to 36.9 in adult 06/17/2021   Pure hypercholesterolemia 06/17/2021   Erectile dysfunction 06/21/2020   DM (diabetes mellitus), type 2 ([place]) 07/02/2015   HTN (hypertension) 07/02/2015    History reviewed. No pertinent surgical history.     Home Medications    Prior to Admission medications   Medication Sig Start Date End Date Taking? Authorizing Provider  doxycycline (VIBRAMYCIN) 100 MG capsule Take 1 capsule (100 mg total) by mouth 2 (two) times daily. 05/02/22  Yes Margarette Canada, NP  hydrochlorothiazide (MICROZIDE) 12.5 MG capsule Take 1 capsule (12.5 mg total) by mouth daily. 01/16/22  Yes Baity, Coralie Keens, NP  lisinopril (ZESTRIL) 10 MG  tablet Take 1 tablet (10 mg total) by mouth daily. 01/16/22  Yes Baity, Coralie Keens, NP  Menaquinone-7 (VITAMIN K2 PO) Take by mouth.    [provider]  tadalafil (CIALIS) 10 MG tablet Take 1 tablet (10 mg total) by mouth daily as needed for erectile dysfunction. 09/26/21   Jearld Fenton, NP    Family History Family History  Problem Relation Age of Onset   Heart disease Mother    Liver cancer Father    Heart disease Father    Hearing loss Maternal Grandmother    Stroke Paternal Grandmother     Social History Social History   Tobacco Use   Smoking status: Never   Smokeless tobacco: Never  Vaping Use   Vaping Use: Never used  Substance Use Topics   Alcohol use: No   Drug use: No     Allergies   Patient has no known allergies.   Review of Systems Review of Systems  Constitutional:  Negative for fever.  Musculoskeletal:  Positive for myalgias.  Skin:  Positive for color change and wound.  Neurological:  Positive for numbness. Negative for weakness.  Hematological: Negative.   Psychiatric/Behavioral: Negative.       Physical Exam Triage Vital Signs ED Triage Vitals  Enc Vitals Group     BP 05/02/22 1242 Marland Kitchen)  135/92     Pulse Rate 05/02/22 1242 69     Resp 05/02/22 1242 15     Temp 05/02/22 1242 98 F (36.7 C)     Temp Source 05/02/22 1242 Oral     SpO2 05/02/22 1242 98 %     Weight 05/02/22 1240 272 lb 0.8 oz (123.4 kg)     Height 05/02/22 1240 6' (1.829 m)     Head Circumference --      Peak Flow --      Pain Score 05/02/22 1239 5     Pain Loc --      Pain Edu? --      Excl. in GC? --    No data found.  Updated Vital Signs BP (!) 135/92 (BP Location: Left Arm)   Pulse 69   Temp 98 F (36.7 C) (Oral)   Resp 15   Ht 6' (1.829 m)   Wt 272 lb 0.8 oz (123.4 kg)   SpO2 98%   BMI 36.90 kg/m   Visual Acuity Right Eye Distance:   Left Eye Distance:   Bilateral Distance:    Right Eye Near:   Left Eye Near:    Bilateral Near:     Physical  Exam Vitals and nursing note reviewed.  Constitutional:      Appearance: Normal appearance. He is normal weight. He is not ill-appearing.  HENT:     Head: Normocephalic and atraumatic.  Musculoskeletal:        General: Swelling, tenderness and signs of injury present. No deformity. Normal range of motion.  Skin:    General: Skin is warm and dry.     Capillary Refill: Capillary refill takes less than 2 seconds.     Findings: Erythema present.  Neurological:     General: No focal deficit present.     Mental Status: He is alert and oriented to person, place, and time.  Psychiatric:        Mood and Affect: Mood normal.        Behavior: Behavior normal.        Thought Content: Thought content normal.        Judgment: Judgment normal.           UC Treatments / Results  Labs (all labs ordered are listed, but only abnormal results are displayed) Labs Reviewed - No data to display  EKG   Radiology No results found.  Procedures Procedures (including critical care time)  Medications Ordered in UC Medications - No data to display  Initial Impression / Assessment and Plan / UC Course  I have reviewed the triage vital signs and the nursing notes.  Pertinent labs & imaging results that were available during my care of the patient were reviewed by me and considered in my medical decision making (see chart for details).  Patient is a nontoxic-appearing 56 year old male with significant history of type 2 diabetes, diabetic neuropathy diabetic foot ulcers with gangrene, hypertension, and hypercholesterolemia who presents today for evaluation of pain and swelling to his right foot and right lower leg.  He states that the pain began approximately 2 to 3 days ago and he describes it as an excruciating pain that was in the front and back of his lower leg.  No pain in his calf at all.  Patient states that he thinks he is eating too much salt because he has been eating a lot of watermelon  with salt on it as well as chicken salad.  He states that his sugars have been "pretty good" but he does not know the number.  Patient does have a bandage on his right great toe as well.  There is edema of the distal right lower leg, ankle, and foot that is 1+ nonpitting.  Patient has no calf pain and has a negative Denna Haggard' sign on the right.  DP and PT pulses are 2+.  I had the patient remove the bandage and once removed revealed a open wound.  The wound is covered by flap of skin and when this flap is retracted there is a pink wound bed.  No drainage appreciated from the wound.  There is a tunnel tract that runs distal from this wound.  I asked the patient how he achieved the wound and he says he is not sure.  It looks as if he stepped on a sharp object but patient denies any known injury.  The surrounding tissue is erythematous and hot to touch.  The erythema extends along the medial arch of the foot.  There is no induration or fluctuance.  The exam does indicate that the patient has developed cellulitis.  I will treat him with doxycycline twice daily for 10 days.  I have advised him that he needs to keep the wound clean and dry and open to air when he is at home.  He can cover with a bandage when he is at work.  With his history of nonhealing diabetic wounds I have advised him that if he develops increased swelling, redness, drainage, red streaks going up his leg, or fever that he needs to present to the ER for evaluation and IV antibiotics.  I also instructed him to follow-up with his PCP this coming week for reevaluation to make sure that his wound is healing.   Final Clinical Impressions(s) / UC Diagnoses   Final diagnoses:  Cellulitis of right lower extremity  Puncture wound of toe of right foot, initial encounter     Discharge Instructions      Take the Doxycycline twice daily with food for 10 days.  Doxycycline will make you more sensitive to sunburn so wear sunscreen when outdoors and  reapply it every 90 minutes.  Keep the wound on your toe clean and dry.  Leave the wound open to air at home and cover with a bandage when at work.  Use OTC Tylenol and Ibuprofen according to the package instructions as needed for pain.  Make a follow-up appointment with your PCP for a wound check in 1 week.  Return for new or worsening symptoms.  If you develop increased redness, swelling, red streaks up your legs, or fever you need to go to the ER.      ED Prescriptions     Medication Sig Dispense Auth. Provider   doxycycline (VIBRAMYCIN) 100 MG capsule Take 1 capsule (100 mg total) by mouth 2 (two) times daily. 20 capsule Becky Augusta, NP      PDMP not reviewed this encounter.   Becky Augusta, NP 05/02/22 1316

## 2022-05-12 ENCOUNTER — Ambulatory Visit
Admission: EM | Admit: 2022-05-12 | Discharge: 2022-05-12 | Disposition: A | Discharge status: Home / Self Care | Payer: Self-pay | Attending: Physician Assistant | Admitting: Physician Assistant

## 2022-05-12 ENCOUNTER — Encounter: Payer: Self-pay | Admitting: Family Medicine

## 2022-05-12 ENCOUNTER — Ambulatory Visit (INDEPENDENT_AMBULATORY_CARE_PROVIDER_SITE_OTHER): Payer: Self-pay | Admitting: Family Medicine

## 2022-05-12 ENCOUNTER — Ambulatory Visit: Payer: Self-pay | Admitting: *Deleted

## 2022-05-12 VITALS — BP 118/74 | HR 72 | Ht 72.0 in | Wt 272.0 lb

## 2022-05-12 DIAGNOSIS — L97521 Non-pressure chronic ulcer of other part of left foot limited to breakdown of skin: Secondary | ICD-10-CM

## 2022-05-12 DIAGNOSIS — E11621 Type 2 diabetes mellitus with foot ulcer: Secondary | ICD-10-CM

## 2022-05-12 DIAGNOSIS — I10 Essential (primary) hypertension: Secondary | ICD-10-CM

## 2022-05-12 DIAGNOSIS — M5442 Lumbago with sciatica, left side: Secondary | ICD-10-CM

## 2022-05-12 DIAGNOSIS — M545 Low back pain, unspecified: Secondary | ICD-10-CM

## 2022-05-12 MED ORDER — KETOROLAC TROMETHAMINE 30 MG/ML IJ SOLN
30.0000 mg | Freq: Once | INTRAMUSCULAR | Status: AC
  Administered 2022-05-12: 30 mg via INTRAMUSCULAR

## 2022-05-12 MED ORDER — AMOXICILLIN-POT CLAVULANATE 875-125 MG PO TABS: 1.0000 | ORAL_TABLET | Freq: Two times a day (BID) | ORAL | 0 refills | Status: DC

## 2022-05-12 MED ORDER — GABAPENTIN 100 MG PO CAPS: ORAL_CAPSULE | ORAL | 1 refills | Status: DC

## 2022-05-12 MED ORDER — LISINOPRIL 5 MG PO TABS: 5.0000 mg | ORAL_TABLET | Freq: Every day | ORAL | 0 refills | Status: DC

## 2022-05-12 MED ORDER — PREDNISONE 10 MG PO TABS: ORAL_TABLET | ORAL | 0 refills | Status: DC

## 2022-05-12 MED ORDER — TIZANIDINE HCL 4 MG PO TABS: 4.0000 mg | ORAL_TABLET | Freq: Three times a day (TID) | ORAL | 0 refills | Status: DC | PRN

## 2022-05-12 NOTE — Telephone Encounter (Signed)
  Chief Complaint: severe lower back pain radiating down left leg Symptoms: above Frequency: now since this morning  Pertinent Negatives: Patient denies anything helping the pain Disposition: [x] ED /[] Urgent Care (no appt availability in office) / [] Appointment(In office/virtual)/ []  [facility]/ [] Home Care/ [] Refused Recommended Disposition /[]  Mobile Bus/ []  Follow-up with PCP Additional Notes: Pt on the way to the [facility] [place] when he called into the practice.   Appt. Made for 3:00 today with Dr. even though he is going to urgent care now for pain relief.

## 2022-05-12 NOTE — ED Triage Notes (Signed)
Patient presents to [facility] for lower back pain.   Pain started a few weeks ago but has gradually got worse.   Patient denies any injury to his back.   Patient reports that he does have a 3pm appt with his PCP but he could not wait that long.

## 2022-05-12 NOTE — Telephone Encounter (Signed)
Reason for Disposition  Weakness of a leg or foot (e.g., unable to bear weight, dragging foot)  Answer Assessment - Initial Assessment Questions 1. ONSET: "When did the pain begin?"      I'm having severe pain down my left leg from my hip.   Every time I try to stand it hurts really bad.   I think it's the muscles at my rear next to my back. 2. LOCATION: "Where does it hurt?" (upper, mid or lower back)     The muscles at my rear near my back and down my left leg.  3. SEVERITY: "How bad is the pain?"  (e.g., Scale 1-10; mild, moderate, or severe)   - MILD (1-3): Doesn't interfere with normal activities.    - MODERATE (4-7): Interferes with normal activities or awakens from sleep.    - SEVERE (8-10): Excruciating pain, unable to do any normal activities.      Severe 4. PATTERN: "Is the pain constant?" (e.g., yes, no; constant, intermittent)      Yes 5. RADIATION: "Does the pain shoot into your legs or somewhere else?"     Yes down my left leg  6. CAUSE:  "What do you think is causing the back pain?"      I'm not sure.    I was supposed to work 11 PM - 11 AM tonight but I don't think I'm going to make it. 7. BACK OVERUSE:  "Any recent lifting of heavy objects, strenuous work or exercise?"     I bent over to put my shoes this morning and it's it's been hurting since. 8. MEDICINES: "What have you taken so far for the pain?" (e.g., nothing, acetaminophen, NSAIDS)     inbuprofen 9. NEUROLOGIC SYMPTOMS: "Do you have any weakness, numbness, or problems with bowel/bladder control?"     No 10. OTHER SYMPTOMS: "Do you have any other symptoms?" (e.g., fever, abdomen pain, burning with urination, blood in urine)       Pain down left leg 11. PREGNANCY: "Is there any chance you are pregnant?" "When was your last menstrual period?"       N/A  Protocols used: Back Pain-A-AH

## 2022-05-12 NOTE — Progress Notes (Signed)
Subjective:    Patient ID: George Colon, male    DOB: November 29, 1965, 56 y.o.   MRN: [id number]  George Colon is a 56 y.o. male presenting on 05/12/2022 for Toe Pain  Patient presents for a same day appointment. Walk in today after Urgent Care.  PCP George Silversmith, FNP  Self pay  HPI  His apt was for tomorrow, but moved to today  Seen earlier [facility] at [facility]  CHRONIC HTN: Reports improved readings, would like lower dose Current Meds - Lisinopril 89m daily, previously on 272min past. Now would like to take 39m29m Reports good compliance, took meds today. Tolerating well, w/o complaints. Denies CP, dyspnea, HA, edema, dizziness / lightheadedness  Right Lower Extremity Swelling Toe Infection 1 week ago, [facility], he has history of diabetes and neuropathy. He was given Doxycycline at that time. It has improved, not worsening, there is area under callus that he can feel. No open ulceration or drainage. No further redness or swelling. He has the toe injury and did not realize, history of DM neuropathy  Chronic Low Back Pain Seen at UC today, Injection for pain with Toradol, limited results Also given steroid taper Prednisone 6 day. He is asking on dosing and benefit Tylenol + Ibuprofen, not helping. He is worried about bulging disc. No acute injury, but has severe pain in lower back SI region L side and some radiating symptoms, worse with position change transition or bending, lifting They gave him Tizanidine took 1 dose unsure if helping.  Will need doctor's note, light duty for 1 week      01/13/2022    8:29 AM 06/17/2021    7:56 PM 03/26/2020   10:45 AM  Depression screen PHQ 2/9  Decreased Interest 0 0 0  Down, Depressed, Hopeless 0 0 0  PHQ - 2 Score 0 0 0  Altered sleeping 0  1  Tired, decreased energy 0  0  Change in appetite 0  0  Feeling bad or failure about yourself  0  0  Trouble concentrating 0  0   Moving slowly or fidgety/restless 0  0  Suicidal thoughts 0  0  PHQ-9 Score 0  1  Difficult doing work/chores Not difficult at all  Not difficult at all    Social History   Tobacco Use   Smoking status: Never   Smokeless tobacco: Never  Vaping Use   Vaping Use: Never used  Substance Use Topics   Alcohol use: No   Drug use: No    Review of Systems Per HPI unless specifically indicated above     Objective:    BP 118/74   Pulse 72   Ht 6' (1.829 m)   Wt 272 lb (123.4 kg)   SpO2 98%   BMI 36.89 kg/m   Wt Readings from Last 3 Encounters:  05/12/22 272 lb (123.4 kg)  05/12/22 272 lb (123.4 kg)  05/02/22 272 lb 0.8 oz (123.4 kg)    Physical Exam Vitals and nursing note reviewed.  Constitutional:      General: He is not in acute distress.    Appearance: He is well-developed. He is obese. He is not diaphoretic.     Comments: Well-appearing, uncomfortable with standing position change, cooperative  HENT:     Head: Normocephalic and atraumatic.  Eyes:     General:        Right eye: No  discharge.        Left eye: No discharge.     Conjunctiva/sclera: Conjunctivae normal.  Neck:     Thyroid: No thyromegaly.  Cardiovascular:     Rate and Rhythm: Normal rate and regular rhythm.     Pulses: Normal pulses.     Heart sounds: Normal heart sounds. No murmur heard. Pulmonary:     Effort: Pulmonary effort is normal. No respiratory distress.     Breath sounds: Normal breath sounds. No wheezing or rales.  Musculoskeletal:        General: Normal range of motion.     Cervical back: Normal range of motion and neck supple.     Comments: Low Back Inspection: Normal appearance, Large body habitus, no spinal deformity, symmetrical. Palpation: No tenderness over spinous processes. Left lower paraspinal with hypertonicity and spasm. Has localized provoked pain SI region ROM: Full active ROM forward flex / back extension, rotation L/R without discomfort Special Testing: Seated SLR  positive for radicular pain L side Strength: Bilateral hip flex/ext 5/5, knee flex/ext 5/5, ankle dorsiflex/plantarflex 5/5 Neurovascular: intact distal sensation to light touch   Lymphadenopathy:     Cervical: No cervical adenopathy.  Skin:    General: Skin is warm and dry.     Findings: Lesion (Left foot callus formation left great toe) present. No erythema or rash.  Neurological:     Mental Status: He is alert and oriented to person, place, and time. Mental status is at baseline.     Sensory: Sensory deficit (chronic lower extremity foot reduced sensation) present.  Psychiatric:        Behavior: Behavior normal.     Comments: Well groomed, good eye contact, normal speech and thoughts    Results for orders placed or performed in visit on 01/13/22  COMPLETE METABOLIC PANEL WITH GFR  Result Value Ref Range   Glucose, Bld 255 (H) 65 - 99 mg/dL   BUN 14 7 - 25 mg/dL   Creat 0.75 0.70 - 1.30 mg/dL   eGFR 107 > OR = 60 mL/min/1.15m   BUN/Creatinine Ratio NOT APPLICABLE 6 - 22 (calc)   Sodium 138 135 - 146 mmol/L   Potassium 4.2 3.5 - 5.3 mmol/L   Chloride 103 98 - 110 mmol/L   CO2 28 20 - 32 mmol/L   Calcium 8.1 (L) 8.6 - 10.3 mg/dL   Total Protein 5.2 (L) 6.1 - 8.1 g/dL   Albumin 3.4 (L) 3.6 - 5.1 g/dL   Globulin 1.8 (L) 1.9 - 3.7 g/dL (calc)   AG Ratio 1.9 1.0 - 2.5 (calc)   Total Bilirubin 0.3 0.2 - 1.2 mg/dL   Alkaline phosphatase (APISO) 45 35 - 144 U/L   AST 20 10 - 35 U/L   ALT 22 9 - 46 U/L  Hemoglobin A1c  Result Value Ref Range   Hgb A1c MFr Bld 9.8 (H) <5.7 % of total Hgb   Mean Plasma Glucose 235 mg/dL   eAG (mmol/L) 13.0 mmol/L      Assessment & Plan:   Problem List Items Addressed This Visit     HTN (hypertension)   Relevant Medications   lisinopril (ZESTRIL) 5 MG tablet   Other Visit Diagnoses     Acute left-sided low back pain with left-sided sciatica    -  Primary   Relevant Medications   tiZANidine (ZANAFLEX) 4 MG tablet   gabapentin  (NEURONTIN) 100 MG capsule   Diabetic ulcer of toe of left foot associated with type 2  diabetes mellitus, limited to breakdown of skin (HCC)       Relevant Medications   amoxicillin-clavulanate (AUGMENTIN) 875-125 MG tablet   lisinopril (ZESTRIL) 5 MG tablet       Back Pain Suspect underlying 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 Re ordered Tizanidine today Finish Prednisone taper 6 day Recommend to start taking Tylenol Extra Strength max dosing per AVS START anti inflammatory topical - OTC Voltaren (generic Diclofenac) topical 2-4 times a day as needed for pain swelling of affected joint for 1-2 weeks or longer. Add Gabapentin titration as well. Discussed that I would not proceed w/ opiates at this time, based on current scenario, acute on chronic back pain, will need time to heal and proceed w/ current therapy, already started at urgent care. May warrant back x-ray when ready or pursue ortho vs PT, however limitations due to self pay, has declined referral today.  L Toe DM Ulceration w/ callus formation Improved on antibiotic already 1 week later from urgent care, doxycycline No extending cellulitis or other complication Recommend wound care, limited by self pay lack of coverage today Added Augmentin antibiotic if need for empiric coverage  HTN Improved, will lower Lisinopril from 10 to 91m daily  Meds ordered this encounter  Medications   tiZANidine (ZANAFLEX) 4 MG tablet    Sig: Take 1 tablet (4 mg total) by mouth every 8 (eight) hours as needed.    Dispense:  60 tablet    Refill:  0   amoxicillin-clavulanate (AUGMENTIN) 875-125 MG tablet    Sig: Take 1 tablet by mouth 2 (two) times daily.    Dispense:  20 tablet    Refill:  0   gabapentin (NEURONTIN) 100 MG capsule    Sig: Start 1 capsule daily, increase by 1 cap every 2-3 days as tolerated up to 3 times a day, or may take 3 at once in evening.     Dispense:  90 capsule    Refill:  1   lisinopril (ZESTRIL) 5 MG tablet    Sig: Take 1 tablet (5 mg total) by mouth daily.    Dispense:  90 tablet    Refill:  0      Follow up plan: Return if symptoms worsen or fail to improve.   ANobie Putnam DLattimerGroup 05/12/2022, 3:36 PM

## 2022-05-12 NOTE — Discharge Instructions (Addendum)
-  Likely have a bulging disc and pinched nerve. - Follow-up with your PCP.  You may need an MRI.  BACK PAIN: Stressed avoiding painful activities . RICE (REST, ICE, COMPRESSION, ELEVATION) guidelines reviewed. May alternate ice and heat. Consider use of muscle rubs, Salonpas patches, etc. Use medications as directed including muscle relaxers if prescribed. Take anti-inflammatory medications as prescribed or OTC NSAIDs/Tylenol.  F/u with PCP in 7-10 days for reexamination, and please feel free to call or return to the urgent care at any time for any questions or concerns you may have and we will be happy to help you!   BACK PAIN RED FLAGS: If the back pain acutely worsens or there are any red flag symptoms such as numbness/tingling, leg weakness, saddle anesthesia, or loss of bowel/bladder control, go immediately to the ER. Follow up with [name] as scheduled or sooner if the pain does not begin to resolve or if it worsens before the follow up

## 2022-05-12 NOTE — ED Provider Notes (Signed)
[facility]    CSN: [id number] Arrival date & time: 05/12/22  1121      History   Chief Complaint Chief Complaint  Patient presents with   Back Pain    left    HPI George Colon is a 56 y.o. male presenting for 1 month intermittent history of lower back pain, especially left-sided lower back pain.  Patient says the pain got worse yesterday and especially today.  He reports that he has been taking Tylenol/Advil combination pills and they were helping the pain until today.  The pain radiates occasionally into his groin area and left hip.  Denies any pain down the leg, numbness/tingling or weakness.  Patient reports a lot of pain when he moves certain ways, stands and walks, bends forward, rolls over.  Reports increased pain when also riding in the vehicle.  He says that he has had an issue with his back in the past and his vertebrae would pop out of place and his wife had to push them back into place.  He is not reporting any urinary symptoms.  He is diet-controlled diabetic and also has history of hypertension and hyperlipidemia.  He has an appointment with his PCP in a couple of hours but came in today to see if he needed imaging and something for pain.  HPI  Past Medical History:  Diagnosis Date   Anxiety    Depression    Diabetes mellitus without complication (HCC)    Hypertension     Patient Active Problem List   Diagnosis Date Noted   Aortic atherosclerosis (HCC) 01/13/2022   Class 2 obesity due to excess calories with body mass index (BMI) of 36.0 to 36.9 in adult 06/17/2021   Pure hypercholesterolemia 06/17/2021   Erectile dysfunction 06/21/2020   DM (diabetes mellitus), type 2 (HCC) 07/02/2015   HTN (hypertension) 07/02/2015    History reviewed. No pertinent surgical history.     Home Medications    Prior to Admission medications   Medication Sig Start Date End Date Taking? Authorizing Provider  predniSONE (DELTASONE) 10 MG tablet Take 6 tabs  p.o. on day 1 and decrease by 1 tablet daily until complete 05/12/22  Yes Eusebio Friendly B, PA-C  tiZANidine (ZANAFLEX) 4 MG tablet Take 1 tablet (4 mg total) by mouth every 8 (eight) hours as needed for up to 7 days. 05/12/22 05/19/22 Yes Shirlee Latch, PA-C  doxycycline (VIBRAMYCIN) 100 MG capsule Take 1 capsule (100 mg total) by mouth 2 (two) times daily. 05/02/22   Becky Augusta, NP  hydrochlorothiazide (MICROZIDE) 12.5 MG capsule Take 1 capsule (12.5 mg total) by mouth daily. 01/16/22   Lorre Munroe, NP  lisinopril (ZESTRIL) 10 MG tablet Take 1 tablet (10 mg total) by mouth daily. 01/16/22   Lorre Munroe, NP  Menaquinone-7 (VITAMIN K2 PO) Take by mouth.    [provider]  tadalafil (CIALIS) 10 MG tablet Take 1 tablet (10 mg total) by mouth daily as needed for erectile dysfunction. 09/26/21   Lorre Munroe, NP    Family History Family History  Problem Relation Age of Onset   Heart disease Mother    Liver cancer Father    Heart disease Father    Hearing loss Maternal Grandmother    Stroke Paternal Grandmother     Social History Social History   Tobacco Use   Smoking status: Never   Smokeless tobacco: Never  Vaping Use   Vaping Use: Never used  Substance  Use Topics   Alcohol use: No   Drug use: No     Allergies   Patient has no known allergies.   Review of Systems Review of Systems  Gastrointestinal:  Negative for abdominal pain.  Genitourinary:  Negative for dysuria and flank pain.  Musculoskeletal:  Positive for back pain. Negative for gait problem.  Neurological:  Negative for weakness and numbness.     Physical Exam Triage Vital Signs ED Triage Vitals  Enc Vitals Group     BP      Pulse      Resp      Temp      Temp src      SpO2      Weight      Height      Head Circumference      Peak Flow      Pain Score      Pain Loc      Pain Edu?      Excl. in [place]?    No data found.  Updated Vital Signs BP (S) (!) 127/90 (BP Location: Left Arm)    Pulse 70   Temp 97.8 F (36.6 C) (Oral)   Ht 6' (1.829 m)   Wt 272 lb (123.4 kg)   SpO2 100%   BMI 36.89 kg/m      Physical Exam Vitals and nursing note reviewed.  Constitutional:      General: He is not in acute distress.    Appearance: Normal appearance. He is well-developed. He is not ill-appearing.     Comments: Appears in pain, occasionally screams in pain when he moves certain ways.  HENT:     Head: Normocephalic and atraumatic.  Eyes:     General: No scleral icterus.    Conjunctiva/sclera: Conjunctivae normal.  Cardiovascular:     Rate and Rhythm: Normal rate and regular rhythm.     Heart sounds: Normal heart sounds.  Pulmonary:     Effort: Pulmonary effort is normal. No respiratory distress.     Breath sounds: Normal breath sounds.  Musculoskeletal:     Cervical back: Neck supple.     Lumbar back: Tenderness (L4-L5, L5-S1) present. Decreased range of motion. Negative right straight leg raise test and negative left straight leg raise test.  Skin:    General: Skin is warm and dry.     Capillary Refill: Capillary refill takes less than 2 seconds.  Neurological:     General: No focal deficit present.     Mental Status: He is alert. Mental status is at baseline.     Motor: No weakness.     Gait: Gait normal.  Psychiatric:        Mood and Affect: Mood normal.        Behavior: Behavior normal.      UC Treatments / Results  Labs (all labs ordered are listed, but only abnormal results are displayed) Labs Reviewed - No data to display  EKG   Radiology No results found.  Procedures Procedures (including critical care time)  Medications Ordered in UC Medications  ketorolac (TORADOL) 30 MG/ML injection 30 mg (has no administration in time range)    Initial Impression / Assessment and Plan / UC Course  I have reviewed the triage vital signs and the nursing notes.  Pertinent labs & imaging results that were available during my care of the patient were  reviewed by me and considered in my medical decision making (see chart for details).  57 year old male presenting for severe lower back pain for the past 1 month intermittently but worse over the past day.  Not responding to Tylenol/Advil.  Patient is very uncomfortable and occasionally screams in pain when he moves.  He has very reduced range of motion.  He does have mild tenderness to palpation on at L4-L5 and L5-S1.  Negative straight leg raise.  He does report increased pain in the back but he has no pain in the leg when I lift his leg.  Patient has had no injury or trauma to the back.  Advised him that he does not necessarily need x-rays and may actually need an MRI to evaluate for herniated disc/spinal stenosis or pinched nerve or other condition causing his severe pain.  Patient says that he does not need the x-ray he does not want to get it.  Patient given 30 mg IM ketorolac in clinic for acute pain relief.  Sent prednisone taper to pharmacy as well as tizanidine.  Advised may continue Tylenol for pain relief.  Keep follow-up appointment with PCP for this afternoon.  ED precautions for back pain reviewed.  Follow-up with [name] as needed.   Final Clinical Impressions(s) / UC Diagnoses   Final diagnoses:  Acute left-sided low back pain without sciatica     Discharge Instructions      -Likely have a bulging disc and pinched nerve. - Follow-up with your PCP.  You may need an MRI.  BACK PAIN: Stressed avoiding painful activities . RICE (REST, ICE, COMPRESSION, ELEVATION) guidelines reviewed. May alternate ice and heat. Consider use of muscle rubs, Salonpas patches, etc. Use medications as directed including muscle relaxers if prescribed. Take anti-inflammatory medications as prescribed or OTC NSAIDs/Tylenol.  F/u with PCP in 7-10 days for reexamination, and please feel free to call or return to the urgent care at any time for any questions or concerns you may have and we will be happy to  help you!   BACK PAIN RED FLAGS: If the back pain acutely worsens or there are any red flag symptoms such as numbness/tingling, leg weakness, saddle anesthesia, or loss of bowel/bladder control, go immediately to the ER. Follow up with [name] as scheduled or sooner if the pain does not begin to resolve or if it worsens before the follow up       ED Prescriptions     Medication Sig Dispense Auth. Provider   predniSONE (DELTASONE) 10 MG tablet Take 6 tabs p.o. on day 1 and decrease by 1 tablet daily until complete 21 tablet Eusebio Friendly B, PA-C   tiZANidine (ZANAFLEX) 4 MG tablet Take 1 tablet (4 mg total) by mouth every 8 (eight) hours as needed for up to 7 days. 20 tablet Gareth Morgan      PDMP not reviewed this encounter.   Shirlee Latch, PA-C 05/12/22 1211

## 2022-05-12 NOTE — Patient Instructions (Addendum)
Thank you for coming to the office today.  Light duty note  Lisinopril 10 to 5mg   Re ordered Tizanidine today  Finish Prednisone  Recommend to start taking Tylenol Extra Strength 500mg  tabs - take 1 to 2 tabs per dose (max 1000mg ) every 6-8 hours for pain (take regularly, don't skip a dose for next 7 days), max 24 hour daily dose is 6 tablets or 3000mg . In the future you can repeat the same everyday Tylenol course for 1-2 weeks at a time.   START anti inflammatory topical - OTC Voltaren (generic Diclofenac) topical 2-4 times a day as needed for pain swelling of affected joint for 1-2 weeks or longer.  Added Augmentin antibiotic if you need for sign of infection on toe  Please schedule a Follow-up Appointment to: Return if symptoms worsen or fail to improve.  If you have any other questions or concerns, please feel free to call the office or send a message through MyChart. You may also schedule an earlier appointment if necessary.  Additionally, you may be receiving a survey about your experience at our office within a few days to 1 week by e-mail or mail. We value your feedback.  , DO [facility], 

## 2022-05-13 ENCOUNTER — Ambulatory Visit: Payer: Self-pay | Admitting: Family Medicine

## 2022-07-16 ENCOUNTER — Ambulatory Visit
Admission: EM | Admit: 2022-07-16 | Discharge: 2022-07-16 | Disposition: A | Discharge status: Home / Self Care | Payer: Self-pay | Attending: Physician Assistant | Admitting: Physician Assistant

## 2022-07-16 DIAGNOSIS — Z792 Long term (current) use of antibiotics: Secondary | ICD-10-CM | POA: Insufficient documentation

## 2022-07-16 DIAGNOSIS — J069 Acute upper respiratory infection, unspecified: Secondary | ICD-10-CM | POA: Insufficient documentation

## 2022-07-16 DIAGNOSIS — Z1152 Encounter for screening for COVID-19: Secondary | ICD-10-CM | POA: Insufficient documentation

## 2022-07-16 DIAGNOSIS — H6123 Impacted cerumen, bilateral: Secondary | ICD-10-CM | POA: Insufficient documentation

## 2022-07-16 LAB — RESP PANEL BY RT-PCR (FLU A&B, COVID) ARPGX2
Influenza A by PCR: NEGATIVE
Influenza B by PCR: NEGATIVE
SARS Coronavirus 2 by RT PCR: NEGATIVE

## 2022-07-16 MED ORDER — AZITHROMYCIN 250 MG PO TABS: 250.0000 mg | ORAL_TABLET | Freq: Every day | ORAL | 0 refills | Status: DC

## 2022-07-16 NOTE — Discharge Instructions (Signed)
COVID and flu testing negative  Begin azithromycin as directed to provide bacterial coverage for your symptoms    You can take Tylenol and/or Ibuprofen as needed for fever reduction and pain relief.   For cough: honey 1/2 to 1 teaspoon (you can dilute the honey in water or another fluid).  You can also use guaifenesin and dextromethorphan for cough. You can use a humidifier for chest congestion and cough.  If you don't have a humidifier, you can sit in the bathroom with the hot shower running.      For sore throat: try warm salt water gargles, cepacol lozenges, throat spray, warm tea or water with lemon/honey, popsicles or ice, or OTC cold relief medicine for throat discomfort.   For congestion: take a daily anti-histamine like Zyrtec, Claritin, and a oral decongestant, such as pseudoephedrine.  You can also use Flonase 1-2 sprays in each nostril daily.   It is important to stay hydrated: drink plenty of fluids (water, gatorade/powerade/pedialyte, juices, or teas) to keep your throat moisturized and help further relieve irritation/discomfort.

## 2022-07-16 NOTE — ED Triage Notes (Signed)
Patient presents to UC for cough, nasal congestion, fever -- started about 5 days ago.   Patient also reports that there is a spot on his right foot he would like to have looked at and he thinks he cracked his ribs.

## 2022-07-16 NOTE — ED Provider Notes (Addendum)
[facility]    CSN: [id number] Arrival date & time: 07/16/22  0800      History   Chief Complaint Chief Complaint  Patient presents with   Cough   Fever   Nasal Congestion    HPI George Colon is a 56 y.o. male.   Patient presents with increased fatigue, malaise, fever, nasal congestion, rhinorrhea, sinus pain and pressure, dry, nonproductive cough and headaches for 5 to 7 days.  Symptoms worsen 2 days ago after being in the emergency department with the family member.  Tolerating food and liquids.  No initial sick contacts.  Has attempted use of pseudoephedrine and aspirin which have been minimally effective.  History of diabetes, hypertension.     Past Medical History:  Diagnosis Date   Anxiety    Depression    Diabetes mellitus without complication (HCC)    Hypertension     Patient Active Problem List   Diagnosis Date Noted   Aortic atherosclerosis (HCC) 01/13/2022   Class 2 obesity due to excess calories with body mass index (BMI) of 36.0 to 36.9 in adult 06/17/2021   Pure hypercholesterolemia 06/17/2021   Erectile dysfunction 06/21/2020   DM (diabetes mellitus), type 2 (HCC) 07/02/2015   HTN (hypertension) 07/02/2015    History reviewed. No pertinent surgical history.     Home Medications    Prior to Admission medications   Medication Sig Start Date End Date Taking? Authorizing Provider  amoxicillin-clavulanate (AUGMENTIN) 875-125 MG tablet Take 1 tablet by mouth 2 (two) times daily. 05/12/22   Karamalegos, Netta Neat, DO  gabapentin (NEURONTIN) 100 MG capsule Start 1 capsule daily, increase by 1 cap every 2-3 days as tolerated up to 3 times a day, or may take 3 at once in evening. 05/12/22   Karamalegos, Netta Neat, DO  hydrochlorothiazide (MICROZIDE) 12.5 MG capsule Take 1 capsule (12.5 mg total) by mouth daily. 01/16/22   Lorre Munroe, NP  lisinopril (ZESTRIL) 5 MG tablet Take 1 tablet (5 mg total) by mouth daily. 05/12/22    Karamalegos, Netta Neat, DO  Menaquinone-7 (VITAMIN K2 PO) Take by mouth.    [provider]  predniSONE (DELTASONE) 10 MG tablet Take 6 tabs p.o. on day 1 and decrease by 1 tablet daily until complete 05/12/22   Eusebio Friendly B, PA-C  tadalafil (CIALIS) 10 MG tablet Take 1 tablet (10 mg total) by mouth daily as needed for erectile dysfunction. 09/26/21   Lorre Munroe, NP  tiZANidine (ZANAFLEX) 4 MG tablet Take 1 tablet (4 mg total) by mouth every 8 (eight) hours as needed. 05/12/22   Karamalegos, Netta Neat, DO    Family History Family History  Problem Relation Age of Onset   Heart disease Mother    Liver cancer Father    Heart disease Father    Hearing loss Maternal Grandmother    Stroke Paternal Grandmother     Social History Social History   Tobacco Use   Smoking status: Never   Smokeless tobacco: Never  Vaping Use   Vaping Use: Never used  Substance Use Topics   Alcohol use: No   Drug use: No     Allergies   Patient has no known allergies.   Review of Systems Review of Systems  Constitutional:  Positive for fatigue and fever. Negative for activity change, appetite change, chills, diaphoresis and unexpected weight change.  HENT:  Positive for congestion, rhinorrhea, sinus pressure and sinus pain. Negative for dental problem, drooling, ear discharge,  ear pain, facial swelling, hearing loss, mouth sores, nosebleeds, postnasal drip, sneezing, sore throat, tinnitus, trouble swallowing and voice change.   Respiratory:  Positive for cough. Negative for apnea, choking, chest tightness, shortness of breath, wheezing and stridor.   Cardiovascular: Negative.   Gastrointestinal: Negative.   Skin: Negative.   Neurological:  Positive for headaches. Negative for dizziness, tremors, seizures, syncope, facial asymmetry, speech difficulty, weakness, light-headedness and numbness.     Physical Exam Triage Vital Signs ED Triage Vitals  Enc Vitals Group     BP 07/16/22  0811 (!) 157/98     Pulse Rate 07/16/22 0811 90     Resp --      Temp 07/16/22 0811 99.2 F (37.3 C)     Temp Source 07/16/22 0811 Oral     SpO2 07/16/22 0811 96 %     Weight 07/16/22 0809 270 lb (122.5 kg)     Height 07/16/22 0809 6' (1.829 m)     Head Circumference --      Peak Flow --      Pain Score 07/16/22 0809 0     Pain Loc --      Pain Edu? --      Excl. in [place]? --    No data found.  Updated Vital Signs BP (!) 157/98 (BP Location: Left Arm)   Pulse 90   Temp 99.2 F (37.3 C) (Oral)   Ht 6' (1.829 m)   Wt 270 lb (122.5 kg)   SpO2 96%   BMI 36.62 kg/m   Visual Acuity Right Eye Distance:   Left Eye Distance:   Bilateral Distance:    Right Eye Near:   Left Eye Near:    Bilateral Near:     Physical Exam Constitutional:      Appearance: Normal appearance.  HENT:     Head: Normocephalic.     Right Ear: Tympanic membrane, ear canal and external ear normal. There is impacted cerumen.     Left Ear: Tympanic membrane, ear canal and external ear normal. There is impacted cerumen.     Nose: Congestion and rhinorrhea present.     Mouth/Throat:     Mouth: Mucous membranes are moist.     Pharynx: Oropharynx is clear.  Eyes:     Extraocular Movements: Extraocular movements intact.  Cardiovascular:     Rate and Rhythm: Normal rate and regular rhythm.     Pulses: Normal pulses.     Heart sounds: Normal heart sounds.  Pulmonary:     Effort: Pulmonary effort is normal.     Breath sounds: Normal breath sounds.  Skin:    General: Skin is warm and dry.  Neurological:     Mental Status: He is alert and oriented to person, place, and time. Mental status is at baseline.      UC Treatments / Results  Labs (all labs ordered are listed, but only abnormal results are displayed) Labs Reviewed  RESP PANEL BY RT-PCR (FLU A&B, COVID) [id number]    EKG   Radiology No results found.  Procedures Procedures (including critical care time)  Medications Ordered in  UC Medications - No data to display  Initial Impression / Assessment and Plan / UC Course  I have reviewed the triage vital signs and the nursing notes.  Pertinent labs & imaging results that were available during my care of the patient were reviewed by me and considered in my medical decision making (see chart for details).  Acute upper respiratory infection,  bilateral cerumen impaction  Patient is in no signs of distress nor toxic appearing.  Vital signs are stable.  Low suspicion for pneumonia, pneumothorax or bronchitis and therefore will defer imaging.  COVID and flu negative.  Exam bilateral ear canals are impacted by cerumen, irrigated by nursing staff, tolerated well, successful.  Prescribed azithromycin   May use additional over-the-counter medications as needed for supportive care.  May follow-up with urgent care as needed if symptoms persist or worsen.   Final Clinical Impressions(s) / UC Diagnoses   Final diagnoses:  None   Discharge Instructions   None    ED Prescriptions   None    PDMP not reviewed this encounter.   Valinda Hoar, NP 07/16/22 0922    Valinda Hoar, NP 07/16/22 [phone]

## 2022-10-06 IMAGING — CR DG FOOT COMPLETE 3+V*R*
1 series · 3 of 3 positions shown · non-contrast
Comparison: None

CLINICAL DATA: Black second toe

EXAM:
RIGHT FOOT COMPLETE - 3+ VIEW

[Series 1: dg foot complete right · 0.14mm/px · 3 of 3 slices shown]
[im 1/3]
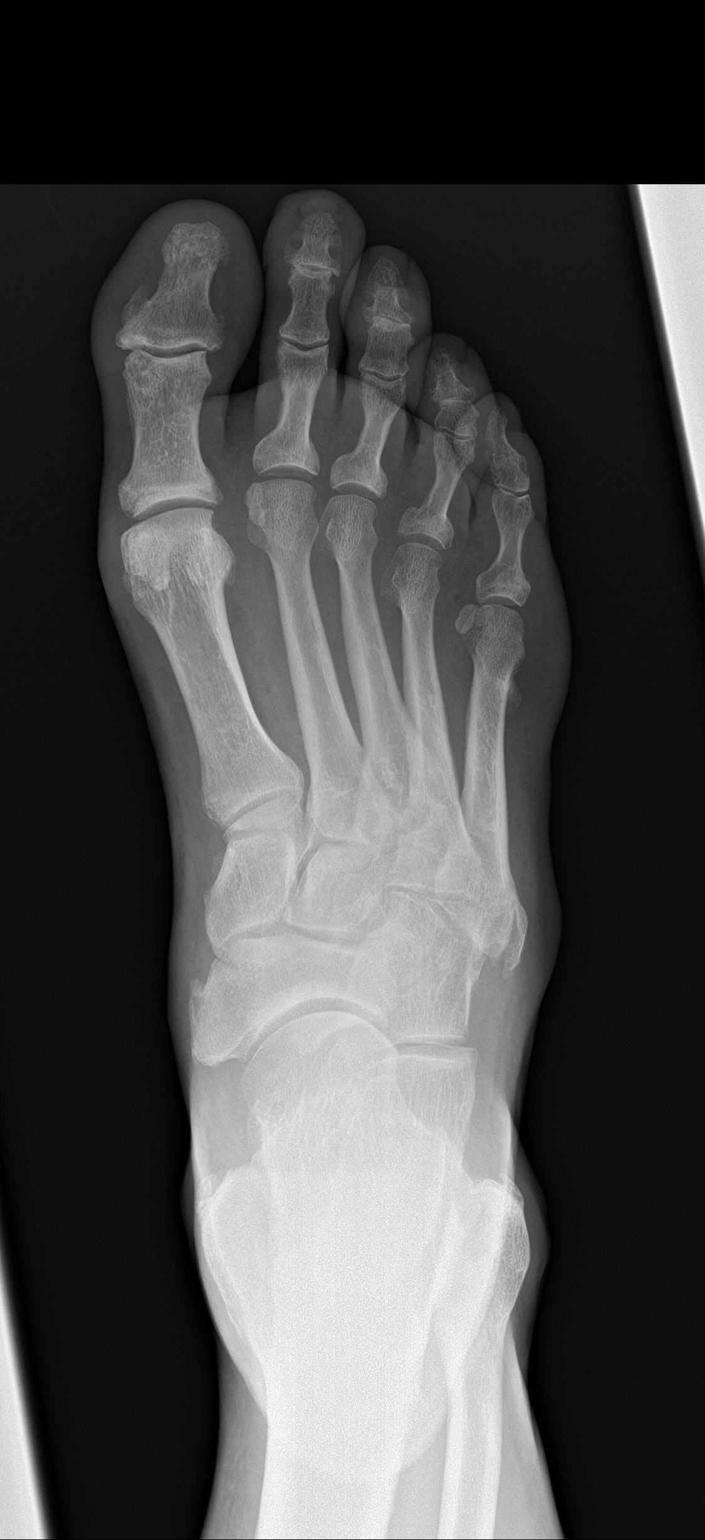
[im 2/3]
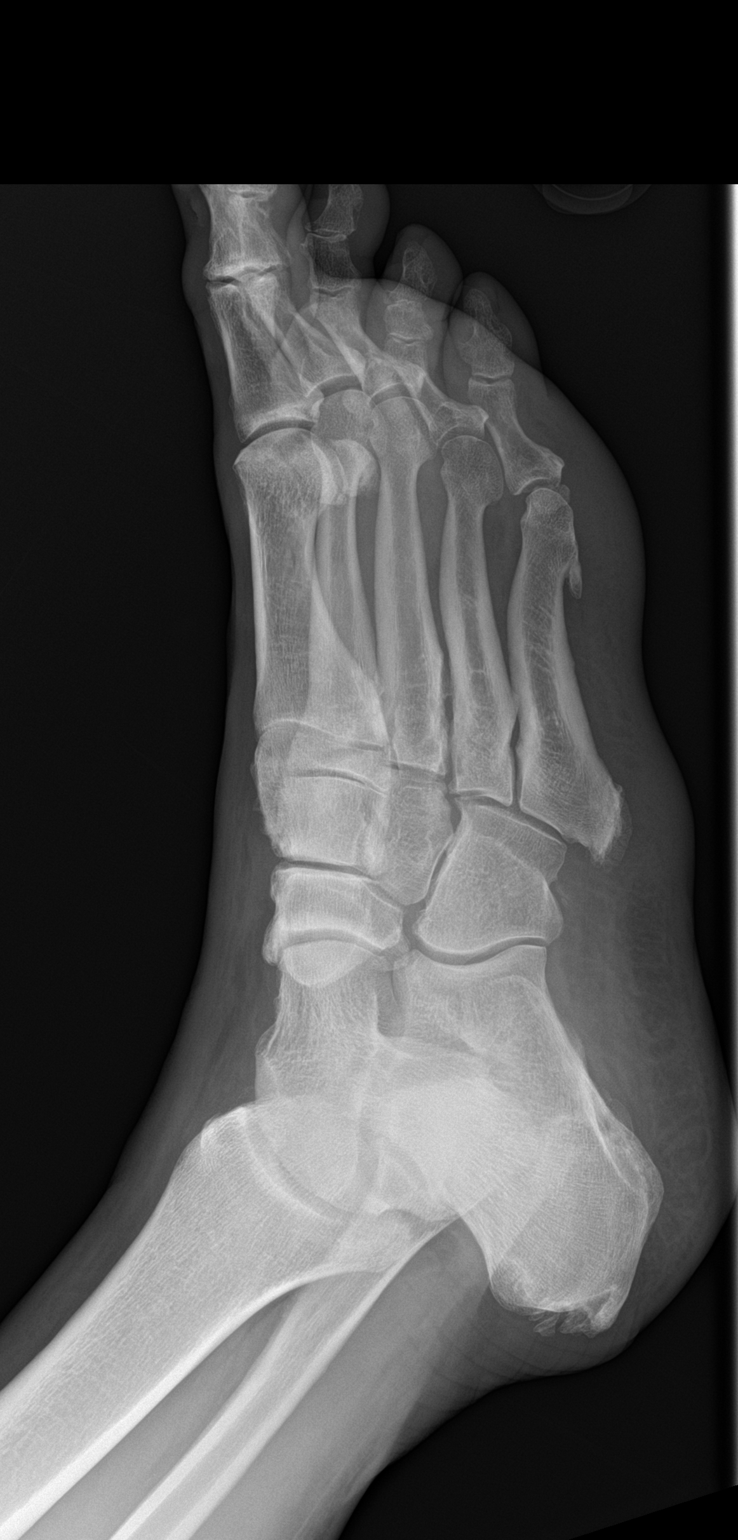
[im 3/3]
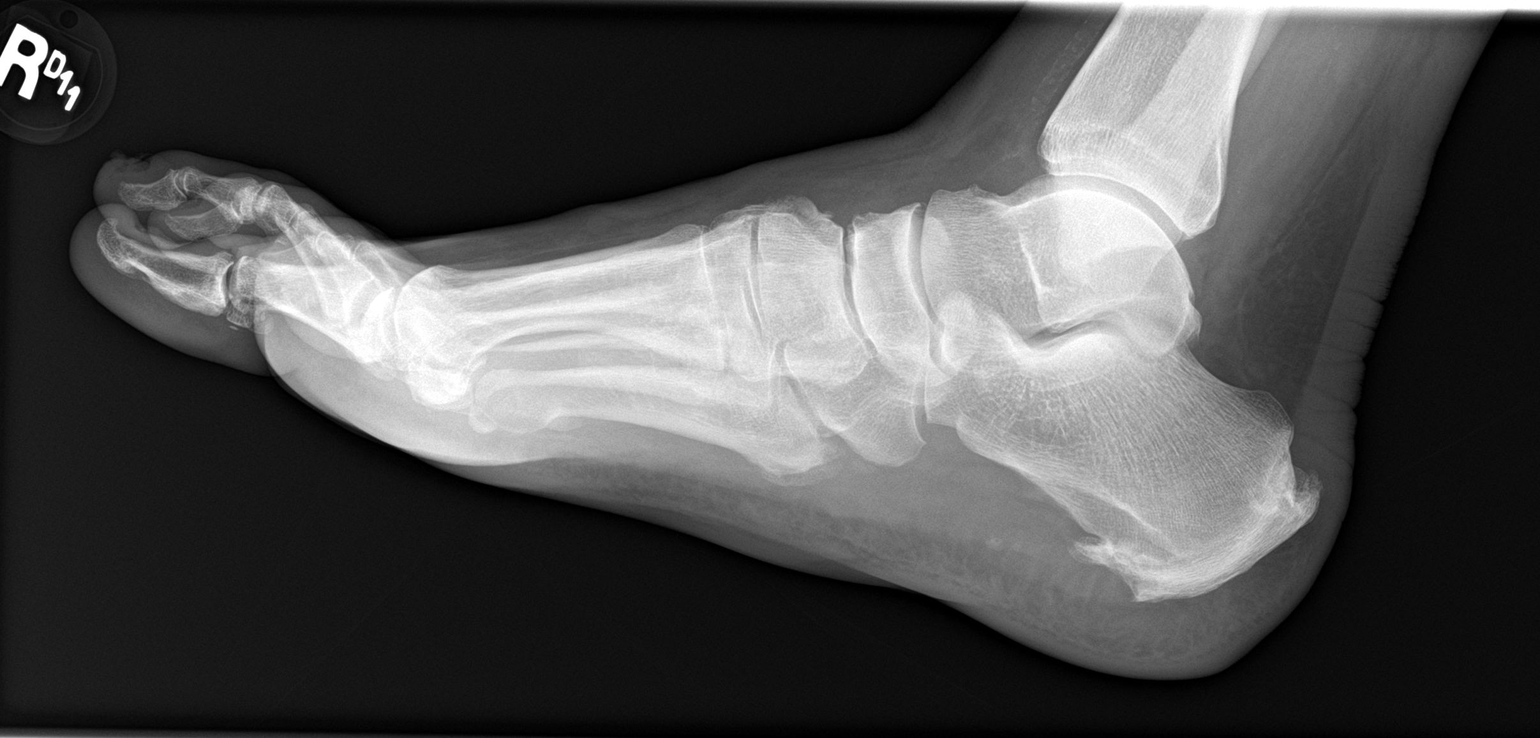

[3 of 3 positions shown; findings below may reference images not displayed]

FINDINGS: Osseous mineralization normal.

Joint spaces preserved.

Probable dressing artifacts at second toe.

No acute fracture, dislocation, or bone destruction. Enthesophytes
at the fifth metatarsal.

Calcaneal spurs at plantar aspect and at Achilles insertion.
IMPRESSION: No acute osseous abnormalities.

## 2022-11-13 ENCOUNTER — Other Ambulatory Visit: Payer: Self-pay

## 2022-11-13 ENCOUNTER — Emergency Department
Admission: EM | Admit: 2022-11-13 | Discharge: 2022-11-14 | Disposition: A | Discharge status: Home / Self Care | Payer: Self-pay | Attending: Emergency Medicine | Admitting: Emergency Medicine

## 2022-11-13 DIAGNOSIS — R739 Hyperglycemia, unspecified: Secondary | ICD-10-CM

## 2022-11-13 DIAGNOSIS — L02416 Cutaneous abscess of left lower limb: Secondary | ICD-10-CM | POA: Insufficient documentation

## 2022-11-13 DIAGNOSIS — E1165 Type 2 diabetes mellitus with hyperglycemia: Secondary | ICD-10-CM | POA: Insufficient documentation

## 2022-11-13 DIAGNOSIS — L03116 Cellulitis of left lower limb: Secondary | ICD-10-CM

## 2022-11-13 DIAGNOSIS — R109 Unspecified abdominal pain: Secondary | ICD-10-CM | POA: Insufficient documentation

## 2022-11-13 DIAGNOSIS — I10 Essential (primary) hypertension: Secondary | ICD-10-CM | POA: Insufficient documentation

## 2022-11-13 DIAGNOSIS — Z79899 Other long term (current) drug therapy: Secondary | ICD-10-CM | POA: Insufficient documentation

## 2022-11-13 LAB — BASIC METABOLIC PANEL
Anion gap: 11 (ref 5–15)
BUN: 10 mg/dL (ref 6–20)
CO2: 22 mmol/L (ref 22–32)
Calcium: 8.5 mg/dL — ABNORMAL LOW (ref 8.9–10.3)
Chloride: 93 mmol/L — ABNORMAL LOW (ref 98–111)
Creatinine, Ser: 0.78 mg/dL (ref 0.61–1.24)
GFR, Estimated: >60 mL/min (ref >60)
Glucose, Bld: 444 mg/dL — ABNORMAL HIGH (ref 70–99)
Potassium: 4.3 mmol/L (ref 3.5–5.1)
Sodium: 126 mmol/L — ABNORMAL LOW (ref 135–145)

## 2022-11-13 LAB — CBC
HCT: 44 % (ref 39.0–52.0)
Hemoglobin: 15.1 g/dL (ref 13.0–17.0)
MCH: 30.8 pg (ref 26.0–34.0)
MCHC: 34.3 g/dL (ref 30.0–36.0)
MCV: 89.6 fL (ref 80.0–100.0)
Platelets: 161 10*3/uL (ref 150–400)
RBC: 4.91 MIL/uL (ref 4.22–5.81)
RDW: 12.8 % (ref 11.5–15.5)
WBC: 10.9 10*3/uL — ABNORMAL HIGH (ref 4.0–10.5)
nRBC: 0 % (ref 0.0–0.2)

## 2022-11-13 MED ORDER — SODIUM CHLORIDE 0.9 % IV SOLN
2.0000 g | Freq: Once | INTRAVENOUS | Status: AC
  Administered 2022-11-14: 2 g via INTRAVENOUS
  Filled 2022-11-13: qty 20

## 2022-11-13 MED ORDER — KETOROLAC TROMETHAMINE 30 MG/ML IJ SOLN
30.0000 mg | Freq: Once | INTRAMUSCULAR | Status: AC
  Administered 2022-11-14: 30 mg via INTRAVENOUS
  Filled 2022-11-13: qty 1

## 2022-11-13 MED ORDER — VANCOMYCIN HCL 1500 MG/300ML IV SOLN
1500.0000 mg | Freq: Once | INTRAVENOUS | Status: DC
  Filled 2022-11-13: qty 300

## 2022-11-13 MED ORDER — SODIUM CHLORIDE 0.9 % IV BOLUS (SEPSIS)
1000.0000 mL | Freq: Once | INTRAVENOUS | Status: AC
  Administered 2022-11-14: 1000 mL via INTRAVENOUS

## 2022-11-13 MED ORDER — VANCOMYCIN HCL IN DEXTROSE 1-5 GM/200ML-% IV SOLN
1000.0000 mg | Freq: Once | INTRAVENOUS | Status: AC
  Administered 2022-11-14: 1000 mg via INTRAVENOUS
  Filled 2022-11-13: qty 200

## 2022-11-13 MED ORDER — LIDOCAINE-PRILOCAINE 2.5-2.5 % EX CREA
TOPICAL_CREAM | Freq: Once | CUTANEOUS | Status: AC
  Administered 2022-11-14: 1 via TOPICAL
  Filled 2022-11-13: qty 5

## 2022-11-13 NOTE — ED Triage Notes (Addendum)
Pt to ED from home for swelling in his pubic area that started about a week ago and it got bigger in the last few days. Pt advised he thinks its a swollen lymph node in his groin area. States his genitals are not affected. Pt is CAOx4 and in no acute distress, ambulatory in triage. Pt states its about the size of a baseball and is painful to palpate.

## 2022-11-13 NOTE — ED Provider Notes (Signed)
[facility] Provider Note    Event Date/Time   First MD Initiated Contact with Patient 11/13/22 2256     (approximate)   History   swelling (Pubic area - possible cyst?)   HPI  George Colon is a 57 y.o. male with history of hypertension, diabetes who presents to the emergency department with complaints of several days of swelling, redness, tenderness in the left inguinal area.  States initially thought this was a lymph node but when it became larger and more painful and difficult to ambulate he became more concerned.  He states that is causing him to have cramps in his legs so he has been taking over-the-counter vitamins and minerals to help.  States he has also had a lot of thirst and states he has been drinking Dr. Reino Kent hoping that the caffeine would help him feel better.  His blood sugar here is 444.  He states his blood sugars are not normally this high.  He denies any fevers, vomiting.  Patient drove himself to the emergency department.   History provided by patient.    Past Medical History:  Diagnosis Date   Anxiety    Depression    Diabetes mellitus without complication (HCC)    Hypertension     History reviewed. No pertinent surgical history.  MEDICATIONS:  Prior to Admission medications   Medication Sig Start Date End Date Taking? Authorizing Provider  amoxicillin-clavulanate (AUGMENTIN) 875-125 MG tablet Take 1 tablet by mouth 2 (two) times daily. 05/12/22   Karamalegos, Netta Neat, DO  azithromycin (ZITHROMAX) 250 MG tablet Take 1 tablet (250 mg total) by mouth daily. Take first 2 tablets together, then 1 every day until finished. 07/16/22   White, Elita Boone, NP  gabapentin (NEURONTIN) 100 MG capsule Start 1 capsule daily, increase by 1 cap every 2-3 days as tolerated up to 3 times a day, or may take 3 at once in evening. 05/12/22   Karamalegos, Netta Neat, DO  hydrochlorothiazide (MICROZIDE) 12.5 MG capsule Take 1 capsule (12.5 mg  total) by mouth daily. 01/16/22   Lorre Munroe, NP  lisinopril (ZESTRIL) 5 MG tablet Take 1 tablet (5 mg total) by mouth daily. 05/12/22   Karamalegos, Netta Neat, DO  Menaquinone-7 (VITAMIN K2 PO) Take by mouth.    [provider]  predniSONE (DELTASONE) 10 MG tablet Take 6 tabs p.o. on day 1 and decrease by 1 tablet daily until complete 05/12/22   Eusebio Friendly B, PA-C  tadalafil (CIALIS) 10 MG tablet Take 1 tablet (10 mg total) by mouth daily as needed for erectile dysfunction. 09/26/21   Lorre Munroe, NP  tiZANidine (ZANAFLEX) 4 MG tablet Take 1 tablet (4 mg total) by mouth every 8 (eight) hours as needed. 05/12/22   Smitty Cords, DO    Physical Exam   Triage Vital Signs: ED Triage Vitals [11/13/22 2236]  Enc Vitals Group     BP (!) 116/96     Pulse Rate 95     Resp 16     Temp 98.2 F (36.8 C)     Temp Source Oral     SpO2 98 %     Weight 265 lb (120.2 kg)     Height 6' (1.829 m)     Head Circumference      Peak Flow      Pain Score 3     Pain Loc      Pain Edu?  Excl. in GC?     Most recent vital signs: Vitals:   11/13/22 2326 11/14/22 0227  BP: (!) 126/90 122/87  Pulse: 94 72  Resp: 16 16  Temp: 98.1 F (36.7 C) 98.7 F (37.1 C)  SpO2: 98% 99%    CONSTITUTIONAL: Alert, responds appropriately to questions. Well-appearing; well-nourished HEAD: Normocephalic, atraumatic EYES: Conjunctivae clear, pupils appear equal, sclera nonicteric ENT: normal nose; moist mucous membranes NECK: Supple, normal ROM CARD: RRR; S1 and S2 appreciated RESP: Normal chest excursion without splinting or tachypnea; breath sounds clear and equal bilaterally; no wheezes, no rhonchi, no rales, no hypoxia or respiratory distress, speaking full sentences ABD/GI: Non-distended; soft, non-tender, no rebound, no guarding, no peritoneal signs GU: Patient has redness, warmth, tenderness and induration noted to the left inguinal area that tracks down to the perineum but  it is difficult to appreciate if there is any crepitus secondary to patient's tenderness on exam.  I do not appreciate any lymphadenopathy but exam limited due to body habitus.  There is no involvement of the left testicle, scrotum. BACK: The back appears normal EXT: Normal ROM in all joints; no deformity noted, no edema SKIN: Normal color for age and race; warm; no rash on exposed skin NEURO: Moves all extremities equally, normal speech PSYCH: The patient's mood and manner are appropriate.   ED Results / Procedures / Treatments   LABS: (all labs ordered are listed, but only abnormal results are displayed) Labs Reviewed  CBC - Abnormal; Notable for the following components:      Result Value   WBC 10.9 (*)    All other components within normal limits  BASIC METABOLIC PANEL - Abnormal; Notable for the following components:   Sodium 126 (*)    Chloride 93 (*)    Glucose, Bld 444 (*)    Calcium 8.5 (*)    All other components within normal limits  URINALYSIS, ROUTINE W REFLEX MICROSCOPIC - Abnormal; Notable for the following components:   Color, Urine YELLOW (*)    APPearance CLEAR (*)    Glucose, UA >=500 (*)    All other components within normal limits  CBG MONITORING, ED - Abnormal; Notable for the following components:   Glucose-Capillary 304 (*)    All other components within normal limits  CULTURE, BLOOD (ROUTINE X 2)  CULTURE, BLOOD (ROUTINE X 2)  AEROBIC/ANAEROBIC CULTURE W GRAM STAIN (SURGICAL/DEEP WOUND)  LACTIC ACID, PLASMA     EKG:   RADIOLOGY: My personal review and interpretation of imaging: CT pelvis shows a 2.4 cm abscess with surrounding inflammatory changes.  No subcutaneous emphysema.  I have personally reviewed all radiology reports.   CT PELVIS W CONTRAST  Result Date: 11/14/2022 CLINICAL DATA:  Left groin pain and swelling, initial encounter EXAM: CT PELVIS WITH CONTRAST TECHNIQUE: Multidetector CT imaging of the pelvis was performed using the  standard protocol following the bolus administration of intravenous contrast. RADIATION DOSE REDUCTION: This exam was performed according to the departmental dose-optimization program which includes automated exposure control, adjustment of the mA and/or kV according to patient size and/or use of iterative reconstruction technique. CONTRAST:  OMNIPAQUE IOHEXOL 300 MG/ML  SOLN COMPARISON:  None Available. FINDINGS: Urinary Tract: Bladder is well distended. No ureteral abnormality is noted. Bowel:  Visualized bowel structures show no acute abnormality. Vascular/Lymphatic: Atherosclerotic calcifications are seen. No pelvic lymphadenopathy is noted. A few tiny reactive lymph nodes are noted in the inguinal regions bilaterally. Reproductive:  Prostate is within normal limits. Other: The  testicles appear within normal limits bilaterally. There is soft tissue swelling along the medial aspect of the inguinal crease on the left. A focal 2.4 cm hypodense area is noted within the inflammatory change likely representing a small abscess. No other focal abnormality is noted. Musculoskeletal: No suspicious bone lesions identified. IMPRESSION: 2.4 cm hypodense area of with surrounding inflammatory change in the medial aspect of the left inguinal crease just above the scrotum. This is consistent with a small subcutaneous abscess. Some mild reactive adenopathy is noted. Electronically Signed   By: Alcide Clever M.D.   On: 11/14/2022 00:46     PROCEDURES:  Critical Care performed: Yes, see critical care procedure note(s)   CRITICAL CARE Performed by: Baxter Hire Brice Kossman   Total critical care time: 35 minutes  Critical care time was exclusive of separately billable procedures and treating other patients.  Critical care was necessary to treat or prevent imminent or life-threatening deterioration.  Critical care was time spent personally by me on the following activities: development of treatment plan with patient and/or  surrogate as well as nursing, discussions with consultants, evaluation of patient's response to treatment, examination of patient, obtaining history from patient or surrogate, ordering and performing treatments and interventions, ordering and review of laboratory studies, ordering and review of radiographic studies, pulse oximetry and re-evaluation of patient's condition.     Marland Kitchen1-3 Lead EKG Interpretation  Performed by: Jamerson Vonbargen, Layla Maw, DO Authorized by: Jachob Mcclean, Layla Maw, DO     Interpretation: normal     ECG rate:  95   ECG rate assessment: normal     Rhythm: sinus rhythm     Ectopy: none     Conduction: normal   ..Incision and Drainage  Date/Time: 11/14/2022 2:26 AM  Performed by: Adelaide Pfefferkorn, Layla Maw, DO Authorized by: Exilda Wilhite, Layla Maw, DO   Consent:    Consent obtained:  Verbal   Consent given by:  Patient   Risks discussed:  Bleeding, incomplete drainage, pain, infection and damage to other organs   Alternatives discussed:  Alternative treatment Universal protocol:    Procedure explained and questions answered to patient or proxy's satisfaction: yes     Relevant documents present and verified: yes     Test results available : yes     Imaging studies available: yes     Required blood products, implants, devices, and special equipment available: yes     Site/side marked: yes     Immediately prior to procedure, a time out was called: yes     Patient identity confirmed:  Verbally with patient Location:    Type:  Abscess   Size:  2.4 cm   Location:  Lower extremity   Lower extremity location:  Leg   Leg location:  L upper leg Pre-procedure details:    Skin preparation:  Povidone-iodine Sedation:    Sedation type:  None Anesthesia:    Anesthesia method:  Local infiltration and topical application   Topical anesthetic:  EMLA cream   Local anesthetic:  Lidocaine 2% WITH epi Procedure type:    Complexity:  Complex Procedure details:    Ultrasound guidance: yes     Needle  aspiration: yes     Needle size:  18 G   Incision depth:  Subcutaneous   Drainage:  Purulent and bloody   Drainage amount:  Moderate   Packing materials:  None Post-procedure details:    Procedure completion:  Tolerated well, no immediate complications     IMPRESSION / MDM / ASSESSMENT AND  PLAN / ED COURSE  I reviewed the triage vital signs and the nursing notes.    Patient here with concerns for cellulitis, possible abscess in the setting of hyperglycemia in the left inguinal area.  Unable to fully evaluate for subcutaneous emphysema due to tenderness, body habitus.  The patient is on the cardiac monitor to evaluate for evidence of arrhythmia and/or significant heart rate changes.   DIFFERENTIAL DIAGNOSIS (includes but not limited to):   Cellulitis, abscess, necrotizing fasciitis, lymphadenopathy, less likely Fournier's gangrene   Patient's presentation is most consistent with acute presentation with potential threat to life or bodily function.   PLAN: CBC and BMP obtained from triage.  Patient has leukocytosis of 10.9.  Normal electrolytes.  Creatinine normal.  Blood glucose 444 with normal bicarb and anion gap.  Will give IV fluids for his hyperglycemia.  Will give Toradol for pain control.  I feel he will need a CT of his pelvis for further evaluation of this area given my exam is somewhat limited due to body habitus and tenderness.  Will give vancomycin and Rocephin to cover for infectious etiology given he does appear to have cellulitis on exam.   MEDICATIONS GIVEN IN ED: Medications  vancomycin (VANCOCIN) IVPB 1000 mg/200 mL premix (0 mg Intravenous Stopped 11/14/22 0225)    Followed by  vancomycin (VANCOREADY) IVPB 1500 mg/300 mL (has no administration in time range)  sodium chloride 0.9 % bolus 1,000 mL (0 mLs Intravenous Stopped 11/14/22 0120)  sodium chloride 0.9 % bolus 1,000 mL (0 mLs Intravenous Stopped 11/14/22 0120)  cefTRIAXone (ROCEPHIN) 2 g in sodium chloride 0.9  % 100 mL IVPB (0 g Intravenous Stopped 11/14/22 0046)  ketorolac (TORADOL) 30 MG/ML injection 30 mg (30 mg Intravenous Given 11/14/22 0010)  lidocaine-prilocaine (EMLA) cream (1 Application Topical Given 11/14/22 0012)  iohexol (OMNIPAQUE) 300 MG/ML solution 100 mL (100 mLs Intravenous Contrast Given 11/14/22 0025)  lidocaine-EPINEPHrine (XYLOCAINE W/EPI) 2 %-1:200000 (PF) injection 20 mL (20 mLs Intradermal Given 11/14/22 0226)     ED COURSE: Patient's blood sugar improved to 304.  Urine shows no ketones.  He is not in DKA today.  He states he is noncompliant with his metformin because he feels like he is able to control his diabetes with diet alone but states the lowest he got his hemoglobin A1c down to was around 7.  Discussed with patient that a normal hemoglobin A1c would be 5.4 and I have strongly encouraged him to restart his metformin.  Offered him a new prescription but he states he has plenty at home.  We discussed that elevated blood sugars can be very dangerous to him and cause infection and prevent healing.   He has a normal lactic today.  Blood cultures are pending.  CT scan reviewed and interpreted by myself and the radiologist and shows a 2.4 cm abscess with surrounding inflammatory changes.  No subcutaneous emphysema.  Due to patient's hyperglycemia and concern for medical noncompliance, discussed with him that I was concerned about making an incision in the inguinal area as I do not feel will heal appropriately.  We opted for needle aspiration.  Used ultrasound to identify largest fluid pockets and was able to remove approximately 5 to 6 mL of bloody purulent fluid.  Wound culture has been sent.  Will discharge on cefdinir and Bactrim.  I usually opt for Keflex for cellulitis but given 4 times daily dosing and patient's history of medical noncompliance, I feel that cefdinir may be the better option.  Will discharge with short course of analgesia.  He does have a PCP for follow-up and have  recommended if his wound is not improving or worsening that he follow-up with his primary care doctor.    At this time, I do not feel there is any life-threatening condition present. I reviewed all nursing notes, vitals, pertinent previous records.  All lab and urine results, EKGs, imaging ordered have been independently reviewed and interpreted by myself.  I reviewed all available radiology reports from any imaging ordered this visit.  Based on my assessment, I feel the patient is safe to be discharged home without further emergent workup and can continue workup as an outpatient as needed. Discussed all findings, treatment plan as well as usual and customary return precautions.  They verbalize understanding and are comfortable with this plan.  Outpatient follow-up has been provided as needed.  All questions have been answered.    CONSULTS:  none   OUTSIDE RECORDS REVIEWED: Reviewed last cardiology note at [facility] in October 2016.       FINAL CLINICAL IMPRESSION(S) / ED DIAGNOSES   Final diagnoses:  Hyperglycemia  Cellulitis and abscess of left lower extremity     Rx / DC Orders   ED Discharge Orders          Ordered    cefdinir (OMNICEF) 300 MG capsule  2 times daily        11/14/22 0224    sulfamethoxazole-trimethoprim (BACTRIM DS) 800-160 MG tablet  2 times daily        11/14/22 0224    Bacillus Coagulans-Inulin (PROBIOTIC) 1-250 BILLION-MG CAPS  2 times daily        11/14/22 0224    HYDROcodone-acetaminophen (NORCO/VICODIN) 5-325 MG tablet  Every 6 hours PRN        11/14/22 0224             Note:  This document was prepared using Dragon voice recognition software and may include unintentional dictation errors.   Raimundo Corbit, Layla Maw, DO 11/14/22 0230

## 2022-11-13 NOTE — Progress Notes (Signed)
PHARMACY -  BRIEF ANTIBIOTIC NOTE   Pharmacy has received consult(s) for Vancomycin from an ED provider.  The patient's profile has been reviewed for ht/wt/allergies/indication/available labs.    One time order(s) placed for Vanc 2500 mg IV X 1   Further antibiotics/pharmacy consults should be ordered by admitting physician if indicated.                       Thank you, Ailee Pates D 11/13/2022  11:23 PM

## 2022-11-14 ENCOUNTER — Emergency Department: Payer: Self-pay

## 2022-11-14 LAB — URINALYSIS, ROUTINE W REFLEX MICROSCOPIC
Bacteria, UA: NONE SEEN
Bilirubin Urine: NEGATIVE
Glucose, UA: >=500 mg/dL — AB
Hgb urine dipstick: NEGATIVE
Ketones, ur: NEGATIVE mg/dL
Leukocytes,Ua: NEGATIVE
Nitrite: NEGATIVE
Protein, ur: NEGATIVE mg/dL
Specific Gravity, Urine: 1.029 (ref 1.005–1.030)
Squamous Epithelial / HPF: NONE SEEN /HPF (ref 0–5)
WBC, UA: NONE SEEN WBC/hpf (ref 0–5)
pH: 5 (ref 5.0–8.0)

## 2022-11-14 LAB — LACTIC ACID, PLASMA: Lactic Acid, Venous: 1.9 mmol/L (ref 0.5–1.9)

## 2022-11-14 LAB — CBG MONITORING, ED: Glucose-Capillary: 304 mg/dL — ABNORMAL HIGH (ref 70–99)

## 2022-11-14 MED ORDER — CEFDINIR 300 MG PO CAPS: 300.0000 mg | ORAL_CAPSULE | Freq: Two times a day (BID) | ORAL | 0 refills | Status: DC

## 2022-11-14 MED ORDER — PROBIOTIC 1-250 BILLION-MG PO CAPS: 1.0000 | ORAL_CAPSULE | Freq: Two times a day (BID) | ORAL | 0 refills | Status: AC

## 2022-11-14 MED ORDER — IOHEXOL 300 MG/ML  SOLN
100.0000 mL | Freq: Once | INTRAMUSCULAR | Status: AC | PRN
  Administered 2022-11-14: 100 mL via INTRAVENOUS

## 2022-11-14 MED ORDER — HYDROCODONE-ACETAMINOPHEN 5-325 MG PO TABS: 1.0000 | ORAL_TABLET | Freq: Four times a day (QID) | ORAL | 0 refills | Status: DC | PRN

## 2022-11-14 MED ORDER — SULFAMETHOXAZOLE-TRIMETHOPRIM 800-160 MG PO TABS: 1.0000 | ORAL_TABLET | Freq: Two times a day (BID) | ORAL | 0 refills | Status: DC

## 2022-11-14 MED ORDER — LIDOCAINE-EPINEPHRINE (PF) 2 %-1:200000 IJ SOLN
20.0000 mL | Freq: Once | INTRAMUSCULAR | Status: AC
  Administered 2022-11-14: 20 mL via INTRADERMAL
  Filled 2022-11-14: qty 20

## 2022-11-14 NOTE — Discharge Instructions (Addendum)
You are being provided a prescription for opiates (also known as narcotics) for pain control.  Opiates can be addictive and should only be used when absolutely necessary for pain control when other alternatives do not work.  We recommend you only use them for the recommended amount of time and only as prescribed.  Please do not take with other sedative medications or alcohol.  Please do not drive, operate machinery, make important decisions while taking opiates.  Please note that these medications can be addictive and have high abuse potential.  Patients can become addicted to narcotics after only taking them for a few days.  Please keep these medications locked away from children, teenagers or any family members with history of substance abuse.  Narcotic pain medicine may also make you constipated.  You may use over-the-counter medications such as MiraLAX, Colace to prevent constipation.  If you become constipated, you may use over-the-counter enemas as needed.  Itching and nausea are also common side effects of narcotic pain medication.  If you develop uncontrolled vomiting or a rash, please stop these medications and seek medical care.   Please take antibiotics until complete.  I recommend taking a probiotic while on antibiotics.  I recommend that you start taking your metformin regularly to help bring your blood sugar down to help prevent infection.

## 2022-11-19 LAB — CULTURE, BLOOD (ROUTINE X 2)
Culture: NO GROWTH
Culture: NO GROWTH

## 2022-11-19 LAB — AEROBIC/ANAEROBIC CULTURE W GRAM STAIN (SURGICAL/DEEP WOUND)

## 2022-11-20 NOTE — Progress Notes (Signed)
ED Antimicrobial Stewardship Positive Culture Follow Up   George Colon is an 57 y.o. male who presented to [facility] on 11/13/2022 with a chief complaint of  Chief Complaint  Patient presents with   swelling    Pubic area - possible cyst?    Recent Results (from the past 720 hour(s))  Blood culture (routine x 2)     Status: None   Collection Time: 11/13/22 11:57 PM   Specimen: BLOOD  Result Value Ref Range Status   Specimen Description BLOOD BLOOD LEFT HAND  Final   Special Requests   Final    BOTTLES DRAWN AEROBIC AND ANAEROBIC Blood Culture results may not be optimal due to an excessive volume of blood received in culture bottles   Culture   Final    NO GROWTH 5 DAYS Performed at [facility], [address] [place], [place] [address]    Report Status 11/19/2022 FINAL  Final  Blood culture (routine x 2)     Status: None   Collection Time: 11/13/22 11:57 PM   Specimen: BLOOD  Result Value Ref Range Status   Specimen Description BLOOD BLOOD LEFT ARM  Final   Special Requests   Final    BOTTLES DRAWN AEROBIC AND ANAEROBIC Blood Culture results may not be optimal due to an excessive volume of blood received in culture bottles   Culture   Final    NO GROWTH 5 DAYS Performed at [facility], [address], [place], [place] [address]    Report Status 11/19/2022 FINAL  Final  Aerobic/Anaerobic Culture w Gram Stain (surgical/deep wound)     Status: None   Collection Time: 11/14/22  1:22 AM   Specimen: Abscess  Result Value Ref Range Status   Specimen Description   Final    ABSCESS Performed at [facility], [address], [place], [place] [address]    Special Requests   Final    NONE Performed at [facility], [address] [place], [place] [address]    Gram Stain   Final    ABUNDANT WBC PRESENT,BOTH PMN AND MONONUCLEAR MODERATE GRAM POSITIVE COCCI IN PAIRS RARE GRAM NEGATIVE RODS    Culture   Final    ABUNDANT  ENTEROCOCCUS FAECALIS MODERATE ESCHERICHIA COLI NO ANAEROBES ISOLATED Performed at [facility], [place] [address], [place], [place] [address]    Report Status 11/19/2022 FINAL  Final   Organism ID, Bacteria ENTEROCOCCUS FAECALIS  Final   Organism ID, Bacteria ESCHERICHIA COLI  Final      Susceptibility   Escherichia coli - MIC*    AMPICILLIN <=2 SENSITIVE Sensitive     CEFEPIME <=0.12 SENSITIVE Sensitive     CEFTAZIDIME <=1 SENSITIVE Sensitive     CEFTRIAXONE <=0.25 SENSITIVE Sensitive     CIPROFLOXACIN <=0.25 SENSITIVE Sensitive     GENTAMICIN <=1 SENSITIVE Sensitive     IMIPENEM <=0.25 SENSITIVE Sensitive     TRIMETH/SULFA <=20 SENSITIVE Sensitive     AMPICILLIN/SULBACTAM <=2 SENSITIVE Sensitive     PIP/TAZO <=4 SENSITIVE Sensitive     * MODERATE ESCHERICHIA COLI   Enterococcus faecalis - MIC*    AMPICILLIN <=2 SENSITIVE Sensitive     VANCOMYCIN 1 SENSITIVE Sensitive     GENTAMICIN SYNERGY RESISTANT Resistant     * ABUNDANT ENTEROCOCCUS FAECALIS    '[x]'$  Treated with cefdinir and Bactrim, organism resistant to prescribed antimicrobial  New antibiotic prescription: amoxicillin 500 mg po TID x 7 days called to  [facility] [place], [place] --called patient and discussed antibiotics, directions & circumstances of new Rx  ED Provider: Kerman Passey, MD   Dallie Piles 11/20/2022, 10:54 AM Clinical Pharmacist

## 2022-12-30 ENCOUNTER — Encounter: Payer: Self-pay | Admitting: Family Medicine

## 2022-12-30 ENCOUNTER — Ambulatory Visit: Payer: Self-pay | Admitting: Family Medicine

## 2022-12-30 VITALS — BP 136/86 | HR 73 | Temp 96.2°F | Wt 263.0 lb

## 2022-12-30 DIAGNOSIS — E1165 Type 2 diabetes mellitus with hyperglycemia: Secondary | ICD-10-CM

## 2022-12-30 DIAGNOSIS — E7800 Pure hypercholesterolemia, unspecified: Secondary | ICD-10-CM

## 2022-12-30 DIAGNOSIS — I10 Essential (primary) hypertension: Secondary | ICD-10-CM

## 2022-12-30 LAB — POCT GLYCOSYLATED HEMOGLOBIN (HGB A1C): Hemoglobin A1C: 10.1 % — AB (ref 4.0–5.6)

## 2022-12-30 MED ORDER — METFORMIN HCL ER 500 MG PO TB24
1000.0000 mg | ORAL_TABLET | Freq: Every day | ORAL | 1 refills | Status: DC
Start: 2022-12-30 — End: 2023-07-22

## 2022-12-30 MED ORDER — HYDROCHLOROTHIAZIDE 12.5 MG PO CAPS: 12.5000 mg | ORAL_CAPSULE | Freq: Every day | ORAL | 1 refills | Status: DC

## 2022-12-30 MED ORDER — LISINOPRIL 5 MG PO TABS: 5.0000 mg | ORAL_TABLET | Freq: Every day | ORAL | 1 refills | Status: DC

## 2022-12-30 NOTE — Progress Notes (Signed)
Subjective:    Patient ID: George Colon, male    DOB: 09-13-1966, 57 y.o.   MRN: [id number]  George Colon is a 57 y.o. male presenting on 12/30/2022 for Diabetes  PCP Webb Silversmith, FNP   HPI  CHRONIC DM, Type 2, Hyperglycemia Hyperlipidemia, Morbid Obesity Lost to follow-up and not adherent on diabetic diet, drinking a lot of sodas and caffeine He has scaled back his work and doing better with his diet Now drinking mostly water A1c today is due, prior 11 month ago was 9.8 CBGs: only recently checking Avg 300+, high 400+ - CBGS 250-270s now with some changes. Meds: no medications currently - previously on Metformin years ago Currently on ACEi Lifestyle: - Diet (goal to improve diabetic diet) Denies hypoglycemia, polyuria, visual changes, numbness or tingling.  CHRONIC HTN: Some elevated BP. Has had some higher BP readings at times feels can be elevated regardless of medication. Current Meds - Lisinopril 10mg  daily, HCTZ 12.5mg  daily Reports good compliance, took meds today. Tolerating well, w/o complaints. Denies CP, dyspnea, HA, edema, dizziness / lightheadedness  OSA not on CPAP   HM Due for physical / cancer screening as eligible, limited by cost     12/30/2022   11:28 AM 01/13/2022    8:29 AM 06/17/2021    7:56 PM  Depression screen PHQ 2/9  Decreased Interest 0 0 0  Down, Depressed, Hopeless 0 0 0  PHQ - 2 Score 0 0 0  Altered sleeping 0 0   Tired, decreased energy 0 0   Change in appetite 0 0   Feeling bad or failure about yourself  0 0   Trouble concentrating 0 0   Moving slowly or fidgety/restless 0 0   Suicidal thoughts 0 0   PHQ-9 Score 0 0   Difficult doing work/chores Not difficult at all Not difficult at all     Social History   Tobacco Use   Smoking status: Never   Smokeless tobacco: Never  Vaping Use   Vaping Use: Never used  Substance Use Topics   Alcohol use: No   Drug use: No    Review of Systems Per HPI unless  specifically indicated above     Objective:    BP 136/86 (BP Location: Left Arm, Patient Position: Sitting, Cuff Size: Normal)   Pulse 73   Temp (!) 96.2 F (35.7 C) (Temporal)   Wt 263 lb (119.3 kg)   SpO2 99%   BMI 35.67 kg/m   Wt Readings from Last 3 Encounters:  12/30/22 263 lb (119.3 kg)  11/13/22 265 lb (120.2 kg)  07/16/22 270 lb (122.5 kg)    Physical Exam Vitals and nursing note reviewed.  Constitutional:      General: He is not in acute distress.    Appearance: Normal appearance. He is well-developed. He is obese. He is not diaphoretic.     Comments: Well-appearing, comfortable, cooperative  HENT:     Head: Normocephalic and atraumatic.  Eyes:     General:        Right eye: No discharge.        Left eye: No discharge.     Conjunctiva/sclera: Conjunctivae normal.  Cardiovascular:     Rate and Rhythm: Normal rate.  Pulmonary:     Effort: Pulmonary effort is normal.  Skin:    General: Skin is warm and dry.     Findings: No erythema or rash.  Neurological:     Mental Status: He  is alert and oriented to person, place, and time.  Psychiatric:        Mood and Affect: Mood normal.        Behavior: Behavior normal.        Thought Content: Thought content normal.     Comments: Well groomed, good eye contact, normal speech and thoughts     Diabetic Foot Exam - Simple   Simple Foot Form Diabetic Foot exam was performed with the following findings: Yes 12/30/2022 12:48 PM  Visual Inspection See comments: Yes Sensation Testing Intact to touch and monofilament testing bilaterally: Yes Pulse Check Posterior Tibialis and Dorsalis pulse intact bilaterally: Yes Comments Callus formation without active ulceration, has great toenail deformity.      Results for orders placed or performed in visit on 12/30/22  POCT glycosylated hemoglobin (Hb A1C)  Result Value Ref Range   Hemoglobin A1C 10.1 (A) 4.0 - 5.6 %      Assessment & Plan:   Problem List Items  Addressed This Visit     DM (diabetes mellitus), type 2 - Primary   Relevant Medications   lisinopril (ZESTRIL) 5 MG tablet   metFORMIN (GLUCOPHAGE-XR) 500 MG 24 hr tablet   Other Relevant Orders   POCT glycosylated hemoglobin (Hb A1C) (Completed)   HTN (hypertension)   Relevant Medications   lisinopril (ZESTRIL) 5 MG tablet   hydrochlorothiazide (MICROZIDE) 12.5 MG capsule    Type 2 Diabetes Hyperlipidemia Morbid obesity Uncontrolled, hyperglycemia Off medication and non adherent to diet Limited with cost and work and he prefers to avoid medication management A1c today 10.1 He will continue improved diabetic diet, and now already improving CBG from 300-400 down to 200s. Will re order Metformin XR 500mg  take 1-2 per day with breakfast as tolerated if he pursues medication, other meds are limited due to higher cost as self pay, but we can consider sulfonylurea if indicated in future. He prefers to avoid.  Continue lifestyle for weight management.  HYPERTENSION Improved control Will re order his medications Lisinopril 5mg  daily (lowered from 10mg ) and HCTZ 12.5mg  daily  Future 6 months needs yearly eval and labwork.  Meds ordered this encounter  Medications   lisinopril (ZESTRIL) 5 MG tablet    Sig: Take 1 tablet (5 mg total) by mouth daily.    Dispense:  90 tablet    Refill:  1   hydrochlorothiazide (MICROZIDE) 12.5 MG capsule    Sig: Take 1 capsule (12.5 mg total) by mouth daily.    Dispense:  90 capsule    Refill:  1   metFORMIN (GLUCOPHAGE-XR) 500 MG 24 hr tablet    Sig: Take 2 tablets (1,000 mg total) by mouth daily with breakfast.    Dispense:  180 tablet    Refill:  1      Follow up plan: Return in about 6 months (around 07/01/2023) for 6 month Yearly Physical / labs A1c with Rollene Fare.    Nobie Putnam, [facility] 12/30/2022, 11:49 AM

## 2022-12-30 NOTE — Patient Instructions (Addendum)
Thank you for coming to the office today.  Recent Labs    01/13/22 0844 12/30/22 1124  HGBA1C 9.8* 10.1*   Re ordered Lisinopril 5mg  and HCTZ 12.5mg   In future if need to adjust BP med Lisinopril, let us know  Re ordered Metformin to restart if you need it. XR release once daily dosing with meal, take 1 or 2.  Next would schedule yearly physical w/ blood work in 6 months w/ Rollene Fare  Please schedule a Follow-up Appointment to: Return in about 6 months (around 07/01/2023) for 6 month Yearly Physical / labs A1c with Rollene Fare.  If you have any other questions or concerns, please feel free to call the office or send a message through [facility]. You may also schedule an earlier appointment if necessary.  Additionally, you may be receiving a survey about your experience at our office within a few days to 1 week by e-mail or mail. We value your feedback.  Nobie Putnam, DO [place]

## 2023-05-11 ENCOUNTER — Encounter: Payer: Self-pay | Admitting: Internal Medicine

## 2023-05-11 DIAGNOSIS — I10 Essential (primary) hypertension: Secondary | ICD-10-CM

## 2023-05-11 MED ORDER — LISINOPRIL 5 MG PO TABS: 5.0000 mg | ORAL_TABLET | Freq: Two times a day (BID) | ORAL | 0 refills | Status: DC

## 2023-07-22 ENCOUNTER — Encounter: Payer: Self-pay | Admitting: Internal Medicine

## 2023-07-22 ENCOUNTER — Ambulatory Visit (INDEPENDENT_AMBULATORY_CARE_PROVIDER_SITE_OTHER): Payer: Self-pay | Admitting: Internal Medicine

## 2023-07-22 VITALS — BP 128/86 | Ht 72.0 in | Wt 263.0 lb

## 2023-07-22 DIAGNOSIS — E66812 Obesity, class 2: Secondary | ICD-10-CM

## 2023-07-22 DIAGNOSIS — N521 Erectile dysfunction due to diseases classified elsewhere: Secondary | ICD-10-CM

## 2023-07-22 DIAGNOSIS — Z6835 Body mass index (BMI) 35.0-35.9, adult: Secondary | ICD-10-CM

## 2023-07-22 DIAGNOSIS — I700 Atherosclerosis of aorta: Secondary | ICD-10-CM

## 2023-07-22 DIAGNOSIS — I10 Essential (primary) hypertension: Secondary | ICD-10-CM

## 2023-07-22 DIAGNOSIS — E1165 Type 2 diabetes mellitus with hyperglycemia: Secondary | ICD-10-CM

## 2023-07-22 DIAGNOSIS — E7800 Pure hypercholesterolemia, unspecified: Secondary | ICD-10-CM

## 2023-07-22 LAB — POCT GLYCOSYLATED HEMOGLOBIN (HGB A1C): Hemoglobin A1C: 10.8 % — AB (ref 4.0–5.6)

## 2023-07-22 MED ORDER — ASPIRIN 81 MG PO TBEC: 81.0000 mg | DELAYED_RELEASE_TABLET | Freq: Every day | ORAL | Status: DC

## 2023-07-22 MED ORDER — METFORMIN HCL ER 500 MG PO TB24
1000.0000 mg | ORAL_TABLET | Freq: Two times a day (BID) | ORAL | 1 refills | Status: DC
Start: 2023-07-22 — End: 2024-04-17

## 2023-07-22 MED ORDER — LISINOPRIL 5 MG PO TABS
5.0000 mg | ORAL_TABLET | Freq: Two times a day (BID) | ORAL | 0 refills | Status: DC
Start: 2023-07-22 — End: 2024-03-30

## 2023-07-22 NOTE — Assessment & Plan Note (Signed)
He reports he can afford lipid panel at this time His increased risk of heart attack, stroke and death Encouraged him to consume a low fat diet We will have him start baby aspirin daily

## 2023-07-22 NOTE — Assessment & Plan Note (Signed)
Controlled We will have him continue lisinopril but will discontinue HCTZ as he is concerned about low blood pressures Reinforced DASH diet and exercise for weight loss He reports he does not have money for metabolic panel today

## 2023-07-22 NOTE — Assessment & Plan Note (Signed)
He is unable to get a lipid panel checked today due to financial limitations Encouraged him to consume a low-fat diet

## 2023-07-22 NOTE — Assessment & Plan Note (Signed)
Encouraged diet and exercise for weight loss ?

## 2023-07-22 NOTE — Progress Notes (Signed)
Subjective:    Patient ID: George Colon, male    DOB: 1966-07-31, 57 y.o.   MRN: [id number]  HPI  Patient presents to clinic today for 33-month follow-up of chronic conditions.  HTN: His BP today is 118/70.  He is taking lisinopril and HCT as prescribed but admits he has been out the last 2 days.  He does feel like his blood pressures are low because he often feels fatigued throughout the day and has intermittent dizziness.  ECG from 03/2017 reviewed.  DM2: His last A1c was 10.1%, 12/2022.  He is not taking metformin as prescribed.  He does not check his sugars.  He does not check his feet routinely.  His last eye exam was >1-year ago.  Flu never.  Pneumovax never.  COVID x 2.  ED: Managed with cialis as needed.  He does not follow with urology.  HLD with aortic atheroscelrosis: There is no recent lipid profile on file.  He is not taking any cholesterol-lowering medication or aspirin at this time.  He does not consume a low-fat diet.  Review of Systems     Past Medical History:  Diagnosis Date   Anxiety    Depression    Diabetes mellitus without complication (HCC)    Hypertension     Current Outpatient Medications  Medication Sig Dispense Refill   hydrochlorothiazide (MICROZIDE) 12.5 MG capsule Take 1 capsule (12.5 mg total) by mouth daily. 90 capsule 1   lisinopril (ZESTRIL) 5 MG tablet Take 1 tablet (5 mg total) by mouth 2 (two) times daily. 180 tablet 0   metFORMIN (GLUCOPHAGE-XR) 500 MG 24 hr tablet Take 2 tablets (1,000 mg total) by mouth daily with breakfast. 180 tablet 1   tadalafil (CIALIS) 10 MG tablet Take 1 tablet (10 mg total) by mouth daily as needed for erectile dysfunction. 15 tablet 2   tiZANidine (ZANAFLEX) 4 MG tablet Take 1 tablet (4 mg total) by mouth every 8 (eight) hours as needed. 60 tablet 0   No current facility-administered medications for this visit.    No Known Allergies  Family History  Problem Relation Age of Onset   Heart disease Mother     Liver cancer Father    Heart disease Father    Hearing loss Maternal Grandmother    Stroke Paternal Grandmother     Social History   Socioeconomic History   Marital status: Single    Spouse name: Not on file   Number of children: Not on file   Years of education: Not on file   Highest education level: Not on file  Occupational History   Occupation: [employer]  Tobacco Use   Smoking status: Never   Smokeless tobacco: Never  Vaping Use   Vaping status: Never Used  Substance and Sexual Activity   Alcohol use: No   Drug use: No   Sexual activity: Yes    Birth control/protection: None  Other Topics Concern   Not on file  Social History Narrative   Not on file   Social Determinants of Health   Financial Resource Strain: Not on file  Food Insecurity: Not on file  Transportation Needs: Not on file  Physical Activity: Not on file  Stress: Not on file  Social Connections: Not on file  Intimate Partner Violence: Not on file     Constitutional: Patient reports fatigue.  Denies fever, malaise, headache or abrupt weight changes.  HEENT: Denies eye pain, eye redness, ear pain, ringing in the ears, wax  buildup, runny nose, nasal congestion, bloody nose, or sore throat. Respiratory: Denies difficulty breathing, shortness of breath, cough or sputum production.   Cardiovascular: Denies chest pain, chest tightness, palpitations or swelling in the hands or feet.  Gastrointestinal: Denies abdominal pain, bloating, constipation, diarrhea or blood in the stool.  GU: Patient reports erectile dysfunction.  Denies urgency, frequency, pain with urination, burning sensation, blood in urine, odor or discharge. Musculoskeletal: Denies decrease in range of motion, difficulty with gait, muscle pain or joint pain and swelling.  Skin: Denies redness, rashes, lesions or ulcercations.  Neurological: Patient reports intermittent dizziness.  Denies difficulty with memory, difficulty with speech or  problems with balance and coordination.  Psych: Denies anxiety, depression, SI/HI.  No other specific complaints in a complete review of systems (except as listed in HPI above).  Objective:   Physical Exam   BP 128/86 (Patient Position: Sitting) Comment: patients machine  Ht 6' (1.829 m)   Wt 263 lb (119.3 kg)   BMI 35.67 kg/m   Wt Readings from Last 3 Encounters:  12/30/22 263 lb (119.3 kg)  11/13/22 265 lb (120.2 kg)  07/16/22 270 lb (122.5 kg)    General: Appears his stated age, obese, in NAD. Skin: Warm, dry and intact. No ulcerations noted. HEENT: Head: normal shape and size; Eyes: sclera white, no icterus, conjunctiva pink, PERRLA and EOMs intact;  Cardiovascular: Normal rate and rhythm. S1,S2 noted.  No murmur, rubs or gallops noted. No JVD or BLE edema. No carotid bruits noted. Pulmonary/Chest: Normal effort and positive vesicular breath sounds. No respiratory distress. No wheezes, rales or ronchi noted.  Musculoskeletal: No difficulty with gait.  Neurological: Alert and oriented. Coordination normal.  Psychiatric: Mood and affect normal. Behavior is normal. Judgment and thought content normal.     BMET    Component Value Date/Time   NA 126 (L) 11/13/2022 2243   K 4.3 11/13/2022 2243   CL 93 (L) 11/13/2022 2243   CO2 22 11/13/2022 2243   GLUCOSE 444 (H) 11/13/2022 2243   BUN 10 11/13/2022 2243   CREATININE 0.78 11/13/2022 2243   CREATININE 0.75 01/13/2022 0844   CALCIUM 8.5 (L) 11/13/2022 2243   GFRNONAA >60 11/13/2022 2243   GFRNONAA 112 03/27/2020 1118   GFRAA 130 03/27/2020 1118    Lipid Panel     Component Value Date/Time   CHOL 168 03/27/2020 1118   TRIG 100 03/27/2020 1118   HDL 34 (L) 03/27/2020 1118   CHOLHDL 4.9 03/27/2020 1118   LDLCALC 113 (H) 03/27/2020 1118    CBC    Component Value Date/Time   WBC 10.9 (H) 11/13/2022 2243   RBC 4.91 11/13/2022 2243   HGB 15.1 11/13/2022 2243   HCT 44.0 11/13/2022 2243   PLT 161 11/13/2022 2243    MCV 89.6 11/13/2022 2243   MCH 30.8 11/13/2022 2243   MCHC 34.3 11/13/2022 2243   RDW 12.8 11/13/2022 2243   LYMPHSABS 1.6 11/27/2020 1035   MONOABS 0.5 11/27/2020 1035   EOSABS 0.0 11/27/2020 1035   BASOSABS 0.0 11/27/2020 1035    Hgb A1C Lab Results  Component Value Date   HGBA1C 10.1 (A) 12/30/2022           Assessment & Plan:     RTC in 3 months, follow-up chronic conditions Nicki Reaper, NP

## 2023-07-22 NOTE — Assessment & Plan Note (Signed)
Continue Cialis as needed 

## 2023-07-22 NOTE — Assessment & Plan Note (Signed)
POCT A1c 10.8% Unable to check urine microalbumin due to financial limitations Encouraged low carb diet and exercise for weight loss Will increase metformin to 1000 mg twice daily and encouraged him to be consistent with this He declines eye exam due to financial limitations Encouraged routine foot exam He declines immunizations

## 2023-07-22 NOTE — Patient Instructions (Signed)

## 2023-08-10 ENCOUNTER — Ambulatory Visit
Admission: EM | Admit: 2023-08-10 | Discharge: 2023-08-10 | Disposition: A | Discharge status: Home / Self Care | Payer: Self-pay

## 2023-08-10 ENCOUNTER — Other Ambulatory Visit: Payer: Self-pay

## 2023-08-10 ENCOUNTER — Emergency Department
Admission: EM | Admit: 2023-08-10 | Discharge: 2023-08-10 | Disposition: A | Discharge status: Home / Self Care | Payer: Self-pay | Attending: Emergency Medicine | Admitting: Emergency Medicine

## 2023-08-10 DIAGNOSIS — K047 Periapical abscess without sinus: Secondary | ICD-10-CM

## 2023-08-10 DIAGNOSIS — I959 Hypotension, unspecified: Secondary | ICD-10-CM | POA: Insufficient documentation

## 2023-08-10 DIAGNOSIS — T50901A Poisoning by unspecified drugs, medicaments and biological substances, accidental (unintentional), initial encounter: Secondary | ICD-10-CM

## 2023-08-10 DIAGNOSIS — R55 Syncope and collapse: Secondary | ICD-10-CM | POA: Insufficient documentation

## 2023-08-10 LAB — GLUCOSE, CAPILLARY: Glucose-Capillary: 296 mg/dL — ABNORMAL HIGH (ref 70–99)

## 2023-08-10 MED ORDER — AMOXICILLIN 500 MG PO CAPS: 500.0000 mg | ORAL_CAPSULE | Freq: Three times a day (TID) | ORAL | 0 refills | Status: DC

## 2023-08-10 NOTE — Discharge Instructions (Addendum)
Go to the emergency room for further evaluation of hypotension secondary to plasma donation and extra blood pressure medication usage.

## 2023-08-10 NOTE — ED Triage Notes (Signed)
Pt comes via EMS from [facility] with c/o tooth pain. Pt was then hypotensive there and they called ems. Pt did give plasma today and then doubled up on his BP meds after. BP-135/76 98%RA CBG-296

## 2023-08-10 NOTE — ED Triage Notes (Signed)
Patient has dental pain on the right side top and bottom. Patient doesn't have a dentist. He has tried Asprin.

## 2023-08-10 NOTE — ED Notes (Signed)
See triage notes. Patient took a double dose of his blood pressure pills and needs antibiotics for his teeth.

## 2023-08-10 NOTE — ED Provider Notes (Signed)
[facility] Provider Note    Event Date/Time   First MD Initiated Contact with Patient 08/10/23 1809     (approximate)   History   Dental Pain (hypotension)   HPI  Oney Dutson is a 57 y.o. male who was sent from urgent care for low blood pressure.  He admits to taking extra blood pressure medications today so that his blood pressure would not be too high for donating plasma.  He then subsequently donated plasma.  He decided afterwards to go to urgent care for antibiotics for dental pain which she has been having and his blood pressure was found to be low.  He was given IV fluids via EMS and feels quite well at this time.     Physical Exam   Triage Vital Signs: ED Triage Vitals  Encounter Vitals Group     BP 08/10/23 1731 120/81     Systolic BP Percentile --      Diastolic BP Percentile --      Pulse Rate 08/10/23 1731 82     Resp 08/10/23 1731 18     Temp 08/10/23 1731 97.6 F (36.4 C)     Temp src --      SpO2 08/10/23 1731 96 %     Weight --      Height --      Head Circumference --      Peak Flow --      Pain Score 08/10/23 1728 9     Pain Loc --      Pain Education --      Exclude from Growth Chart --     Most recent vital signs: Vitals:   08/10/23 1731  BP: 120/81  Pulse: 82  Resp: 18  Temp: 97.6 F (36.4 C)  SpO2: 96%     General: Awake, no distress.  CV:  Good peripheral perfusion.  Resp:  Normal effort.  Abd:  No distention.  Other:  Poor dentition, no evidence of dental abscess   ED Results / Procedures / Treatments   Labs (all labs ordered are listed, but only abnormal results are displayed) Labs Reviewed - No data to display   EKG     RADIOLOGY     PROCEDURES:  Critical Care performed:   Procedures   MEDICATIONS ORDERED IN ED: Medications - No data to display   IMPRESSION / MDM / ASSESSMENT AND PLAN / ED COURSE  I reviewed the triage vital signs and the nursing notes. Patient's  presentation is most consistent with acute complicated illness / injury requiring diagnostic workup.  Patient presents with near syncope as detailed above, this is very good explanation for this given donating plasma and increased blood pressure your medication usage.  He is asymptomatic at this time, his blood pressure is normal.  He received IV fluids via EMS, his glucose is only mildly elevated  No indication for further workup at this time which he agrees with, he is anxious to be discharged, will prescribe antibiotics for dental infection        FINAL CLINICAL IMPRESSION(S) / ED DIAGNOSES   Final diagnoses:  Dental infection  Near syncope     Rx / DC Orders   ED Discharge Orders          Ordered    amoxicillin (AMOXIL) 500 MG capsule  3 times daily        08/10/23 1822  Note:  This document was prepared using Dragon voice recognition software and may include unintentional dictation errors.   Jene Every, MD 08/10/23 [id number]

## 2023-08-10 NOTE — ED Triage Notes (Signed)
Pt to ED via [facility] from home. Pt reports has been having dental pain and has caused his BP to be high. Pt reports BP at home 180s. Pt then reports went to donate plasma and took twice the dosage of his BP meds. Pt sent from [facility] for hypotension.

## 2023-08-10 NOTE — ED Provider Notes (Signed)
[facility]    CSN: [id number] Arrival date & time: 08/10/23  1535      History   Chief Complaint Chief Complaint  Patient presents with   Dental Pain    HPI George Colon is a 57 y.o. male.   57 year old male patient , George Colon, George Colon. , presents to urgent care for evaluation of right sided dental pain . Pt went to plasma donation today and then after donation took extra lisinopril and hydrochlorothiazide, as he was having dental pain and elevated blood pressure, pt is unclear historian as he originally reported taking 30 mg of lisinopril.   Pt has anxiety,depression, Diabetes,hypertension  The history is provided by the patient. No language interpreter was used.    Past Medical History:  Diagnosis Date   Anxiety    Depression    Diabetes mellitus without complication (HCC)    Hypertension     Patient Active Problem List   Diagnosis Date Noted   Dental infection 08/10/2023   Hypotension 08/10/2023   Accidental overdose 08/10/2023   Aortic atherosclerosis (HCC) 01/13/2022   Class 2 obesity due to excess calories with body mass index (BMI) of 35.0 to 35.9 in adult 06/17/2021   Pure hypercholesterolemia 06/17/2021   Erectile dysfunction 06/21/2020   DM (diabetes mellitus), type 2 (HCC) 07/02/2015   HTN (hypertension) 07/02/2015    History reviewed. No pertinent surgical history.     Home Medications    Prior to Admission medications   Medication Sig Start Date End Date Taking? Authorizing Provider  lisinopril (ZESTRIL) 5 MG tablet Take 1 tablet (5 mg total) by mouth 2 (two) times daily. 07/22/23  Yes Lorre Munroe, NP  metFORMIN (GLUCOPHAGE-XR) 500 MG 24 hr tablet Take 2 tablets (1,000 mg total) by mouth 2 (two) times daily with a meal. 07/22/23  Yes Baity, Salvadore Oxford, NP  amoxicillin (AMOXIL) 500 MG capsule Take 1 capsule (500 mg total) by mouth 3 (three) times daily. 08/10/23   Jene Every, MD  aspirin EC 81 MG tablet Take 1 tablet (81  mg total) by mouth daily. Swallow whole. 07/22/23   Lorre Munroe, NP  tadalafil (CIALIS) 10 MG tablet Take 1 tablet (10 mg total) by mouth daily as needed for erectile dysfunction. 09/26/21   Lorre Munroe, NP    Family History Family History  Problem Relation Age of Onset   Heart disease Mother    Liver cancer Father    Heart disease Father    Hearing loss Maternal Grandmother    Stroke Paternal Grandmother     Social History Social History   Tobacco Use   Smoking status: Never   Smokeless tobacco: Never  Vaping Use   Vaping status: Never Used  Substance Use Topics   Alcohol use: No   Drug use: No     Allergies   Patient has no known allergies.   Review of Systems Review of Systems  HENT:  Positive for dental problem. Negative for facial swelling.   All other systems reviewed and are negative.    Physical Exam Triage Vital Signs ED Triage Vitals [08/10/23 1543]  Encounter Vitals Group     BP 118/83     Systolic BP Percentile      Diastolic BP Percentile      Pulse Rate 85     Resp 19     Temp 97.7 F (36.5 C)     Temp Source Oral     SpO2 96 %  Weight      Height      Head Circumference      Peak Flow      Pain Score 7     Pain Loc      Pain Education      Exclude from Growth Chart    No data found.  Updated Vital Signs BP 118/83 (BP Location: Right Arm)   Pulse 85   Temp 97.7 F (36.5 C) (Oral)   Resp 19   SpO2 96%   Visual Acuity Right Eye Distance:   Left Eye Distance:   Bilateral Distance:    Right Eye Near:   Left Eye Near:    Bilateral Near:     Physical Exam Vitals and nursing note reviewed.  Constitutional:      General: He is not in acute distress.    Appearance: He is well-developed. He is ill-appearing and diaphoretic.  HENT:     Head: Normocephalic and atraumatic.     Mouth/Throat:     Dentition: Gingival swelling and dental caries present.     Comments: Poor Dentition throughout, no facial swelling Eyes:      Conjunctiva/sclera: Conjunctivae normal.     Pupils: Pupils are equal, round, and reactive to light.  Cardiovascular:     Rate and Rhythm: Normal rate and regular rhythm.     Pulses: Normal pulses.     Heart sounds: Normal heart sounds. No murmur heard. Pulmonary:     Effort: Pulmonary effort is normal. No respiratory distress.     Breath sounds: Normal breath sounds and air entry.  Abdominal:     Palpations: Abdomen is soft.     Tenderness: There is no abdominal tenderness.  Musculoskeletal:        General: No swelling.     Cervical back: Normal range of motion and neck supple.  Skin:    General: Skin is cool and moist.     Coloration: Skin is pale.  Neurological:     General: No focal deficit present.     Mental Status: He is alert and oriented to person, place, and time.     GCS: GCS eye subscore is 4. GCS verbal subscore is 5. GCS motor subscore is 6.     Cranial Nerves: No cranial nerve deficit.     Sensory: No sensory deficit.  Psychiatric:        Attention and Perception: Attention normal.        Mood and Affect: Mood normal.        Speech: Speech normal.        Behavior: Behavior normal.      UC Treatments / Results  Labs (all labs ordered are listed, but only abnormal results are displayed) Labs Reviewed  GLUCOSE, CAPILLARY - Abnormal; Notable for the following components:      Result Value   Glucose-Capillary 296 (*)    All other components within normal limits    EKG   Radiology No results found.  Procedures Procedures (including critical care time)  Medications Ordered in UC Medications - No data to display  Initial Impression / Assessment and Plan / UC Course  I have reviewed the triage vital signs and the nursing notes.  Pertinent labs & imaging results that were available during my care of the patient were reviewed by me and considered in my medical decision making (see chart for details).  Clinical Course as of 08/10/23 2054  Tue Aug 10, 2023  1623 Pt in  restroom with diarrhea x 2, pt appears pale,clammy,diaphoretic, checked blood sugar and BP, layed patient down, checked BP as pt appeared faint BP 89/66, BS is 296. EMS called for transport to ER. Pt denies Cp or SOB. [JD]  1643 EMS here for transport [JD]    Clinical Course User Index [JD] Jmarion Christiano, Para March, NP    Ddx: Overdose of BP meds, hypovolemic, dental infection Final Clinical Impressions(s) / UC Diagnoses   Final diagnoses:  Dental infection  Hypotension, unspecified hypotension type  Accidental overdose, initial encounter     Discharge Instructions      Go to the emergency room for further evaluation of hypotension secondary to plasma donation and extra blood pressure medication usage.     ED Prescriptions   None    PDMP not reviewed this encounter.   Clancy Gourd, NP 08/10/23 2054

## 2023-08-10 NOTE — ED Notes (Signed)
Patient is being discharged from the Urgent Care and sent to the Emergency Department via EMS . Per Defelice, Para March, NP , patient is in need of higher level of care due to hypotensive . Patient is aware and verbalizes understanding of plan of care.  Vitals:   08/10/23 1543  BP: 118/83  Pulse: 85  Resp: 19  Temp: 97.7 F (36.5 C)  SpO2: 96%

## 2023-09-29 DEATH — deceased

## 2023-10-25 ENCOUNTER — Ambulatory Visit: Payer: Self-pay | Admitting: Internal Medicine

## 2023-12-21 ENCOUNTER — Emergency Department
Admission: EM | Admit: 2023-12-21 | Discharge: 2023-12-21 | Disposition: A | Discharge status: Home / Self Care | Payer: Self-pay | Attending: Emergency Medicine | Admitting: Emergency Medicine

## 2023-12-21 ENCOUNTER — Emergency Department: Payer: Self-pay

## 2023-12-21 ENCOUNTER — Other Ambulatory Visit: Payer: Self-pay

## 2023-12-21 ENCOUNTER — Encounter: Payer: Self-pay | Admitting: Emergency Medicine

## 2023-12-21 DIAGNOSIS — W268XXA Contact with other sharp object(s), not elsewhere classified, initial encounter: Secondary | ICD-10-CM | POA: Insufficient documentation

## 2023-12-21 DIAGNOSIS — E119 Type 2 diabetes mellitus without complications: Secondary | ICD-10-CM | POA: Insufficient documentation

## 2023-12-21 DIAGNOSIS — S91332A Puncture wound without foreign body, left foot, initial encounter: Secondary | ICD-10-CM

## 2023-12-21 DIAGNOSIS — Z23 Encounter for immunization: Secondary | ICD-10-CM | POA: Insufficient documentation

## 2023-12-21 DIAGNOSIS — S91302A Unspecified open wound, left foot, initial encounter: Secondary | ICD-10-CM | POA: Insufficient documentation

## 2023-12-21 MED ORDER — CIPROFLOXACIN HCL 500 MG PO TABS: 500.0000 mg | ORAL_TABLET | Freq: Two times a day (BID) | ORAL | 0 refills | Status: AC

## 2023-12-21 MED ORDER — TETANUS-DIPHTH-ACELL PERTUSSIS 5-2.5-18.5 LF-MCG/0.5 IM SUSY
0.5000 mL | PREFILLED_SYRINGE | Freq: Once | INTRAMUSCULAR | Status: AC
  Administered 2023-12-21: 0.5 mL via INTRAMUSCULAR
  Filled 2023-12-21: qty 0.5

## 2023-12-21 MED ORDER — CIPROFLOXACIN HCL 500 MG PO TABS
500.0000 mg | ORAL_TABLET | Freq: Once | ORAL | Status: AC
  Administered 2023-12-21: 500 mg via ORAL
  Filled 2023-12-21: qty 1

## 2023-12-21 NOTE — Discharge Instructions (Addendum)
 You were seen in the ER today for evaluation of your foot wound.  I suspect you are developing an infection near the site of your puncture.  I sent a prescription for antibiotics to your pharmacy.  Please take these as directed.  Return to the ER if your symptoms are not improving within 48 hours or if they worsen significantly before then.  Otherwise, have included information for follow-up with podiatry for further management.

## 2023-12-21 NOTE — ED Provider Notes (Signed)
 George Colon    Event Date/Time   First MD Initiated Contact with Patient 12/21/23 1031     (approximate)   History   Toe Injury   HPI  George Colon is a 58 year old male with history of T2DM presenting to the emergency department for evaluation of foot injury.  On Saturday he stepped on a screw with his shoe on but did not realize until he took his sock off and noticed blood later.  Has tried washing out the area, but is noticed some increased redness and swelling.  Is able to ambulate without difficulty.  No fevers or chills.     Physical Exam   Triage Vital Signs: ED Triage Vitals  Encounter Vitals Group     BP 12/21/23 1014 137/84     Systolic BP Percentile --      Diastolic BP Percentile --      Pulse Rate 12/21/23 1014 65     Resp 12/21/23 1014 18     Temp 12/21/23 1037 98.1 F (36.7 C)     Temp Source 12/21/23 1037 Oral     SpO2 12/21/23 1014 100 %     Weight 12/21/23 1015 260 lb (117.9 kg)     Height 12/21/23 1015 6' (1.829 m)     Head Circumference --      Peak Flow --      Pain Score 12/21/23 1015 0     Pain Loc --      Pain Education --      Exclude from Growth Chart --     Most recent vital signs: Vitals:   12/21/23 1014 12/21/23 1037  BP: 137/84   Pulse: 65   Resp: 18   Temp:  98.1 F (36.7 C)  SpO2: 100%      General: Awake, interactive  CV:  Regular rate, good peripheral perfusion.  Resp:  Unlabored respirations,  Abd:  Nondistended.  Neuro:  Symmetric facial movement, fluid speech MSK:  There is a puncture wound over the plantar aspect of the right foot.  There is no surrounding erythema, drainage directly along the puncture site.  There is an area of shallow skin breakdown most notably over the medial aspect of the left great toe.  There is erythema of the toe with some generalized swelling of the foot compared to contralateral side.  2+ DP pulses bilaterally.   ED Results / Procedures /  Treatments   Labs (all labs ordered are listed, but only abnormal results are displayed) Labs Reviewed - No data to display   EKG EKG independently reviewed interpreted by myself (ER attending) demonstrates:    RADIOLOGY Imaging independently reviewed and interpreted by myself demonstrates:  X-Leilany Digeronimo without foreign body or soft tissue changes   PROCEDURES:  Critical Care performed: No  Procedures   MEDICATIONS ORDERED IN ED: Medications  ciprofloxacin (CIPRO) tablet 500 mg (has no administration in time range)  Tdap (BOOSTRIX) injection 0.5 mL (0.5 mLs Intramuscular Given 12/21/23 1119)     IMPRESSION / MDM / ASSESSMENT AND PLAN / ED COURSE  I reviewed the triage vital signs and the nursing notes.  Differential diagnosis includes, but is not limited to, cellulitis secondary to recent foot wound, concern for developing diabetic foot ulcer, lower suspicion osteomyelitis  Patient's presentation is most consistent with acute complicated illness / injury requiring diagnostic workup.  58 year old male presenting with foot injury.  Stable vitals on presentation.  Reports that the surgery was  intact when removed, will obtain x-Samit Sylve to further evaluate.  No evidence of sepsis, do think it is reasonable to trial oral antibiotics though did have extensive discussion with patient that he is at increased risk particularly in the setting of his history of diabetes and it is very important that he return should he develop worsening symptoms or lack of improvement within 48 hours.  His tetanus was updated.  As his wound went through his shoe, will include pseudomonal coverage and discharge patient on ciprofloxacin.  Strict return precautions provided.  Patient discharged stable condition.      FINAL CLINICAL IMPRESSION(S) / ED DIAGNOSES   Final diagnoses:  Penetrating wound of left foot, initial encounter     Rx / DC Orders   ED Discharge Orders          Ordered    ciprofloxacin  (CIPRO) 500 MG tablet  2 times daily        12/21/23 1346             Colon:  This document was prepared using Dragon voice recognition software and may include unintentional dictation errors.   Trinna Post, MD 12/21/23 [phone]

## 2023-12-21 NOTE — ED Triage Notes (Signed)
 Patient to ED via POV for left foot, big toe injury. Patient states he stepped on a screw on Saturday. Today having some redness and swelling. Ambulatory without difficulty.

## 2024-01-11 ENCOUNTER — Other Ambulatory Visit: Payer: Self-pay | Admitting: Family Medicine

## 2024-01-11 ENCOUNTER — Encounter: Payer: Self-pay | Admitting: Internal Medicine

## 2024-01-11 DIAGNOSIS — I10 Essential (primary) hypertension: Secondary | ICD-10-CM

## 2024-01-12 ENCOUNTER — Encounter: Payer: Self-pay | Admitting: *Deleted

## 2024-01-12 ENCOUNTER — Emergency Department
Admission: EM | Admit: 2024-01-12 | Discharge: 2024-01-12 | Disposition: A | Discharge status: Home / Self Care | Payer: Self-pay | Attending: Emergency Medicine | Admitting: Emergency Medicine

## 2024-01-12 ENCOUNTER — Other Ambulatory Visit: Payer: Self-pay

## 2024-01-12 DIAGNOSIS — I10 Essential (primary) hypertension: Secondary | ICD-10-CM | POA: Insufficient documentation

## 2024-01-12 DIAGNOSIS — Z79899 Other long term (current) drug therapy: Secondary | ICD-10-CM | POA: Insufficient documentation

## 2024-01-12 DIAGNOSIS — E1165 Type 2 diabetes mellitus with hyperglycemia: Secondary | ICD-10-CM | POA: Insufficient documentation

## 2024-01-12 LAB — CBC
HCT: 41.7 % (ref 39.0–52.0)
Hemoglobin: 14.6 g/dL (ref 13.0–17.0)
MCH: 31.1 pg (ref 26.0–34.0)
MCHC: 35 g/dL (ref 30.0–36.0)
MCV: 88.7 fL (ref 80.0–100.0)
Platelets: 153 10*3/uL (ref 150–400)
RBC: 4.7 MIL/uL (ref 4.22–5.81)
RDW: 12.8 % (ref 11.5–15.5)
WBC: 6.3 10*3/uL (ref 4.0–10.5)
nRBC: 0 % (ref 0.0–0.2)

## 2024-01-12 LAB — BASIC METABOLIC PANEL WITH GFR
Anion gap: 9 (ref 5–15)
BUN: 10 mg/dL (ref 6–20)
CO2: 21 mmol/L — ABNORMAL LOW (ref 22–32)
Calcium: 8.8 mg/dL — ABNORMAL LOW (ref 8.9–10.3)
Chloride: 103 mmol/L (ref 98–111)
Creatinine, Ser: 0.69 mg/dL (ref 0.61–1.24)
GFR, Estimated: >60 mL/min (ref >60)
Glucose, Bld: 218 mg/dL — ABNORMAL HIGH (ref 70–99)
Potassium: 3.4 mmol/L — ABNORMAL LOW (ref 3.5–5.1)
Sodium: 133 mmol/L — ABNORMAL LOW (ref 135–145)

## 2024-01-12 MED ORDER — LISINOPRIL 5 MG PO TABS: 5.0000 mg | ORAL_TABLET | Freq: Every day | ORAL | 2 refills | Status: DC

## 2024-01-12 MED ORDER — HYDROCHLOROTHIAZIDE 25 MG PO TABS: 25.0000 mg | ORAL_TABLET | Freq: Every day | ORAL | 2 refills | Status: DC

## 2024-01-12 MED ORDER — HYDROCHLOROTHIAZIDE 25 MG PO TABS
25.0000 mg | ORAL_TABLET | Freq: Once | ORAL | Status: AC
  Administered 2024-01-12: 25 mg via ORAL
  Filled 2024-01-12: qty 1

## 2024-01-12 NOTE — Telephone Encounter (Signed)
 Unable to refill per protocol, Rx expired. Discontinued 07/22/23.  Requested Prescriptions  Pending Prescriptions Disp Refills   hydrochlorothiazide (MICROZIDE) 12.5 MG capsule [Pharmacy Med Name: hydroCHLOROthiazide 12.5 MG Oral Capsule] 90 capsule 0    Sig: Take 1 capsule by mouth once daily     There is no refill protocol information for this order

## 2024-01-12 NOTE — ED Provider Notes (Signed)
 [facility] Provider Note    Event Date/Time   First MD Initiated Contact with Patient 01/12/24 2012     (approximate)   History   Hypertension   HPI  George Colon is a 58 y.o. male who presents to the ED for evaluation of Hypertension   Reviewed PCP visit from October.  Obese patient with history of HTN, DM.  Prescribed HCTZ and lisinopril.  Patient presents with asymptomatic hypertension.  Reports running out of his hydrochlorothiazide over the past few weeks.  He has been doubling up on his lisinopril to try to help, but reports progressive hypertension at home without symptoms such as chest pain, headache, dyspnea or other concerns.  Reports BP at home with systolic of 170.   Physical Exam   Triage Vital Signs: ED Triage Vitals  Encounter Vitals Group     BP 01/12/24 1817 (!) 152/99     Systolic BP Percentile --      Diastolic BP Percentile --      Pulse Rate 01/12/24 1817 77     Resp 01/12/24 1817 18     Temp 01/12/24 1817 98.5 F (36.9 C)     Temp Source 01/12/24 1817 Oral     SpO2 01/12/24 1817 98 %     Weight 01/12/24 1815 260 lb (117.9 kg)     Height 01/12/24 1815 6' (1.829 m)     Head Circumference --      Peak Flow --      Pain Score 01/12/24 1815 0     Pain Loc --      Pain Education --      Exclude from Growth Chart --     Most recent vital signs: Vitals:   01/12/24 1817  BP: (!) 152/99  Pulse: 77  Resp: 18  Temp: 98.5 F (36.9 C)  SpO2: 98%    General: Awake, no distress.  CV:  Good peripheral perfusion.  Resp:  Normal effort.  Abd:  No distention.  MSK:  No deformity noted.  Neuro:  No focal deficits appreciated. Other:     ED Results / Procedures / Treatments   Labs (all labs ordered are listed, but only abnormal results are displayed) Labs Reviewed  BASIC METABOLIC PANEL WITH GFR - Abnormal; Notable for the following components:      Result Value   Sodium 133 (*)    Potassium 3.4 (*)    CO2 21  (*)    Glucose, Bld 218 (*)    Calcium 8.8 (*)    All other components within normal limits  CBC    EKG   RADIOLOGY   Official radiology report(s): No results found.  PROCEDURES and INTERVENTIONS:  Procedures  Medications  hydrochlorothiazide (HYDRODIURIL) tablet 25 mg (has no administration in time range)     IMPRESSION / MDM / ASSESSMENT AND PLAN / ED COURSE  I reviewed the triage vital signs and the nursing notes.  Differential diagnosis includes, but is not limited to, asymptomatic hypertension, medication noncompliance, stroke, renal failure  {Patient presents with symptoms of an acute illness or injury that is potentially life-threatening.  Patient presents with asymptomatic uncontrolled hypertension suitable for continued outpatient management with refills of his medications.  Provide refills of both.  Hyperglycemia without signs of acidosis.  Normal CBC.  Suitable for outpatient management.      FINAL CLINICAL IMPRESSION(S) / ED DIAGNOSES   Final diagnoses:  Uncontrolled hypertension     Rx /  DC Orders   ED Discharge Orders          Ordered    hydrochlorothiazide (HYDRODIURIL) 25 MG tablet  Daily        01/12/24 2032    lisinopril (ZESTRIL) 5 MG tablet  Daily        01/12/24 2032             Note:  This document was prepared using Dragon voice recognition software and may include unintentional dictation errors.   Arline Bennett, MD 01/12/24 2033

## 2024-01-12 NOTE — ED Triage Notes (Addendum)
 Pt ambulatory to triage.  Pt has high blood pressure.  Pt reports out of bp meds for 1 week.  Pt has appt to see pmd next week   no chest pain or sob.    Pt took 1 lisinipril today.  Out of hydrochlorothiazide.   pt alert   speech clear.

## 2024-01-19 ENCOUNTER — Ambulatory Visit: Payer: Self-pay | Admitting: Internal Medicine

## 2024-03-30 ENCOUNTER — Telehealth: Payer: Self-pay | Admitting: Internal Medicine

## 2024-03-30 DIAGNOSIS — I10 Essential (primary) hypertension: Secondary | ICD-10-CM

## 2024-03-30 NOTE — Telephone Encounter (Unsigned)
 Copied from CRM [id number]. Topic: Clinical - Medication Refill >> Mar 30, 2024  4:07 PM Nathanel BROCKS wrote: Medication: hydrochlorothiazide  (HYDRODIURIL ) 25 MG tablet   lisinopril  (ZESTRIL ) 5 MG tablet  Has the patient contacted their pharmacy? Yes  This is the patient's preferred pharmacy:  [facility] [address], [place] - [address] [place] [address] Phone: [phone] Fax: [phone]  Is this the correct pharmacy for this prescription? Yes If no, delete pharmacy and type the correct one.   Has the prescription been filled recently? Yes  Is the patient out of the medication? Yes  Has the patient been seen for an appointment in the last year OR does the patient have an upcoming appointment? Yes  Can we respond through MyChart? No  Agent: Please be advised that Rx refills may take up to 3 business days. We ask that you follow-up with your pharmacy.

## 2024-04-03 ENCOUNTER — Other Ambulatory Visit: Payer: Self-pay | Admitting: Family Medicine

## 2024-04-03 ENCOUNTER — Other Ambulatory Visit: Payer: Self-pay | Admitting: Internal Medicine

## 2024-04-03 DIAGNOSIS — I10 Essential (primary) hypertension: Secondary | ICD-10-CM

## 2024-04-04 ENCOUNTER — Telehealth: Payer: Self-pay

## 2024-04-04 ENCOUNTER — Other Ambulatory Visit: Payer: Self-pay

## 2024-04-04 MED ORDER — HYDROCHLOROTHIAZIDE 25 MG PO TABS: 25.0000 mg | ORAL_TABLET | Freq: Every day | ORAL | 0 refills | Status: DC

## 2024-04-04 MED ORDER — LISINOPRIL 5 MG PO TABS: 5.0000 mg | ORAL_TABLET | Freq: Two times a day (BID) | ORAL | 0 refills | Status: DC

## 2024-04-04 NOTE — Telephone Encounter (Signed)
 Dosage changed by hospital provider, this dosage 12.5 mg discontinued by Angeline Laura, NP on 07/22/23. Refusing this request.   Requested Prescriptions  Refused Prescriptions Disp Refills   hydrochlorothiazide  (MICROZIDE ) 12.5 MG capsule [Pharmacy Med Name: hydroCHLOROthiazide  12.5 MG Oral Capsule] 90 capsule 0    Sig: Take 1 capsule by mouth once daily     There is no refill protocol information for this order

## 2024-04-04 NOTE — Telephone Encounter (Signed)
 Duplicate request, already refilled on 04/04/24.  Requested Prescriptions  Refused Prescriptions Disp Refills   lisinopril  (ZESTRIL ) 5 MG tablet [Pharmacy Med Name: Lisinopril  5 MG Oral Tablet] 180 tablet 0    Sig: Take 1 tablet by mouth twice daily     There is no refill protocol information for this order

## 2024-04-04 NOTE — Telephone Encounter (Signed)
 Copied from CRM [id number]. Topic: Clinical - Prescription Issue >> Apr 04, 2024 10:46 AM Nurse Olam BROCKS wrote: Refill request sent to office on 04/04/24, pending approval.

## 2024-04-04 NOTE — Telephone Encounter (Signed)
 Requested medication (s) are due for refill today: yes  Requested medication (s) are on the active medication list: yes  Last refill:  01/12/24 Prescribed by Ester Sharps MD at hospital discharge   Future visit scheduled: yes  Notes to clinic:  prescribed at hospital d'c   Requested Prescriptions  Pending Prescriptions Disp Refills   hydrochlorothiazide  (HYDRODIURIL ) 25 MG tablet 30 tablet     Sig: Take 1 tablet (25 mg total) by mouth daily.     Cardiovascular: Diuretics - Thiazide Failed - 04/04/2024  9:35 AM      Failed - K in normal range and within 180 days    Potassium  Date Value Ref Range Status  01/12/2024 3.4 (L) 3.5 - 5.1 mmol/L Final         Failed - Na in normal range and within 180 days    Sodium  Date Value Ref Range Status  01/12/2024 133 (L) 135 - 145 mmol/L Final         Failed - Last BP in normal range    BP Readings from Last 1 Encounters:  01/12/24 (!) 143/105         Failed - Valid encounter within last 6 months    Recent Outpatient Visits   None            Passed - Cr in normal range and within 180 days    Creat  Date Value Ref Range Status  01/13/2022 0.75 0.70 - 1.30 mg/dL Final   Creatinine, Ser  Date Value Ref Range Status  01/12/2024 0.69 0.61 - 1.24 mg/dL Final         Signed Prescriptions Disp Refills   lisinopril  (ZESTRIL ) 5 MG tablet 60 tablet 0    Sig: Take 1 tablet (5 mg total) by mouth 2 (two) times daily.     Cardiovascular:  ACE Inhibitors Failed - 04/04/2024  9:35 AM      Failed - K in normal range and within 180 days    Potassium  Date Value Ref Range Status  01/12/2024 3.4 (L) 3.5 - 5.1 mmol/L Final         Failed - Last BP in normal range    BP Readings from Last 1 Encounters:  01/12/24 (!) 143/105         Failed - Valid encounter within last 6 months    Recent Outpatient Visits   None            Passed - Cr in normal range and within 180 days    Creat  Date Value Ref Range Status  01/13/2022 0.75 0.70  - 1.30 mg/dL Final   Creatinine, Ser  Date Value Ref Range Status  01/12/2024 0.69 0.61 - 1.24 mg/dL Final         Passed - Patient is not pregnant

## 2024-04-04 NOTE — Telephone Encounter (Signed)
 Requested Prescriptions  Pending Prescriptions Disp Refills   hydrochlorothiazide  (HYDRODIURIL ) 25 MG tablet 30 tablet     Sig: Take 1 tablet (25 mg total) by mouth daily.     Cardiovascular: Diuretics - Thiazide Failed - 04/04/2024  9:34 AM      Failed - K in normal range and within 180 days    Potassium  Date Value Ref Range Status  01/12/2024 3.4 (L) 3.5 - 5.1 mmol/L Final         Failed - Na in normal range and within 180 days    Sodium  Date Value Ref Range Status  01/12/2024 133 (L) 135 - 145 mmol/L Final         Failed - Last BP in normal range    BP Readings from Last 1 Encounters:  01/12/24 (!) 143/105         Failed - Valid encounter within last 6 months    Recent Outpatient Visits   None            Passed - Cr in normal range and within 180 days    Creat  Date Value Ref Range Status  01/13/2022 0.75 0.70 - 1.30 mg/dL Final   Creatinine, Ser  Date Value Ref Range Status  01/12/2024 0.69 0.61 - 1.24 mg/dL Final          lisinopril  (ZESTRIL ) 5 MG tablet 60 tablet 0    Sig: Take 1 tablet (5 mg total) by mouth 2 (two) times daily.     Cardiovascular:  ACE Inhibitors Failed - 04/04/2024  9:34 AM      Failed - K in normal range and within 180 days    Potassium  Date Value Ref Range Status  01/12/2024 3.4 (L) 3.5 - 5.1 mmol/L Final         Failed - Last BP in normal range    BP Readings from Last 1 Encounters:  01/12/24 (!) 143/105         Failed - Valid encounter within last 6 months    Recent Outpatient Visits   None            Passed - Cr in normal range and within 180 days    Creat  Date Value Ref Range Status  01/13/2022 0.75 0.70 - 1.30 mg/dL Final   Creatinine, Ser  Date Value Ref Range Status  01/12/2024 0.69 0.61 - 1.24 mg/dL Final         Passed - Patient is not pregnant

## 2024-04-17 ENCOUNTER — Encounter: Payer: Self-pay | Admitting: Family Medicine

## 2024-04-17 ENCOUNTER — Ambulatory Visit (INDEPENDENT_AMBULATORY_CARE_PROVIDER_SITE_OTHER): Payer: Self-pay | Admitting: Family Medicine

## 2024-04-17 VITALS — BP 120/78 | HR 65 | Ht 72.0 in | Wt 252.2 lb

## 2024-04-17 DIAGNOSIS — I10 Essential (primary) hypertension: Secondary | ICD-10-CM

## 2024-04-17 DIAGNOSIS — Z7984 Long term (current) use of oral hypoglycemic drugs: Secondary | ICD-10-CM

## 2024-04-17 DIAGNOSIS — E1165 Type 2 diabetes mellitus with hyperglycemia: Secondary | ICD-10-CM

## 2024-04-17 LAB — POCT GLYCOSYLATED HEMOGLOBIN (HGB A1C): Hemoglobin A1C: 9.9 % — AB (ref 4.0–5.6)

## 2024-04-17 MED ORDER — METFORMIN HCL ER 500 MG PO TB24: 1000.0000 mg | ORAL_TABLET | Freq: Two times a day (BID) | ORAL | 1 refills | Status: AC

## 2024-04-17 MED ORDER — HYDROCHLOROTHIAZIDE 25 MG PO TABS: 25.0000 mg | ORAL_TABLET | Freq: Every day | ORAL | 1 refills | Status: DC

## 2024-04-17 MED ORDER — LISINOPRIL 5 MG PO TABS: 5.0000 mg | ORAL_TABLET | Freq: Two times a day (BID) | ORAL | 1 refills | Status: DC

## 2024-04-17 NOTE — Progress Notes (Unsigned)
 Subjective:    Patient ID: George Colon, male    DOB: 1965-10-14, 58 y.o.   MRN: [id number]  George Colon is a 58 y.o. male presenting on 04/17/2024 for Medical Management of Chronic Issues   HPI  Discussed the use of AI scribe software for clinical note transcription with the patient, who gave verbal consent to proceed.  History of Present Illness   George Colon is a 58 year old male with hypertension and type 2 diabetes who presents for medication management and follow-up.  Hypertension - Managed with lisinopril  5 mg twice daily and hydrochlorothiazide . - Recently ran out of hydrochlorothiazide , resulting in blood pressure elevation - Blood pressure increases at night, associated with sleep apnea. - Takes antihypertensive medications at night to manage nocturnal hypertension. - Experienced a period without medication, leading to a hospital visit for elevated blood pressure. - Previous blood work in April showed slightly low potassium at 3.4 mmol/L; sodium levels were within normal limits.  Type 2 diabetes mellitus - Managed with metformin  extended-release, two tablets twice daily. - Current blood glucose last recorded at 218 mg/dL. - Previously achieved reduction in A1c from 14% during COVID to 6.9% through strict dietary control. - Motivated to avoid insulin therapy. - Interested in continuous glucose monitoring to improve glycemic control.  Obstructive sleep apnea - Sleep apnea contributes to nocturnal hypertension. - Takes antihypertensive medications before bed to mitigate nighttime blood pressure elevations.  Barriers to care and medication adherence - Financial difficulties have impacted ability to consistently afford medications and laboratory tests. 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 - History of toe and dental issues requiring hospital visits, which interfered with completion of planned blood work.           04/17/2024   11:05 AM 12/30/2022   11:28 AM 01/13/2022    8:29 AM  Depression screen PHQ 2/9  Decreased Interest 0 0 0  Down, Depressed, Hopeless 0 0 0  PHQ - 2 Score 0 0 0  Altered sleeping 0 0 0  Tired, decreased energy 0 0 0  Change in appetite 0 0 0  Feeling bad or failure about yourself  0 0 0  Trouble concentrating 0 0 0  Moving slowly or fidgety/restless 0 0 0  Suicidal thoughts 0 0 0  PHQ-9 Score 0 0 0  Difficult doing work/chores Not difficult at all Not difficult at all Not difficult at all       04/17/2024   11:05 AM 12/30/2022   11:28 AM 01/13/2022    8:29 AM  GAD 7 : Generalized Anxiety Score  Nervous, Anxious, on Edge 0 0 0  Control/stop worrying 0 0 0  Worry too much - different things 0 0 0  Trouble relaxing 0 0 0  Restless 0 0 0  Easily annoyed or irritable 0 0 0  Afraid - awful might happen 0 0 0  Total GAD 7 Score 0 0 0  Anxiety Difficulty Not difficult at all Not difficult at all Not difficult at all    Social History   Tobacco Use   Smoking status: Never   Smokeless tobacco: Never  Vaping Use   Vaping status: Never Used  Substance Use Topics   Alcohol use: No   Drug use: No    Review of Systems Per HPI unless specifically indicated above     Objective:    BP 120/78 (BP Location: Right Arm, Patient Position: Sitting,  Cuff Size: Normal)   Pulse 65   Ht 6' (1.829 m)   Wt 252 lb 4 oz (114.4 kg)   SpO2 98%   BMI 34.21 kg/m   Wt Readings from Last 3 Encounters:  04/17/24 252 lb 4 oz (114.4 kg)  01/12/24 260 lb (117.9 kg)  12/21/23 260 lb (117.9 kg)    Physical Exam Vitals and nursing note reviewed.  Constitutional:      General: He is not in acute distress.    Appearance: Normal appearance. He is well-developed. He is not diaphoretic.     Comments: Well-appearing, comfortable, cooperative  HENT:     Head: Normocephalic and atraumatic.  Eyes:     General:        Right eye: No discharge.        Left eye: No discharge.      Conjunctiva/sclera: Conjunctivae normal.  Cardiovascular:     Rate and Rhythm: Normal rate.  Pulmonary:     Effort: Pulmonary effort is normal.  Skin:    General: Skin is warm and dry.     Findings: No erythema or rash.  Neurological:     Mental Status: He is alert and oriented to person, place, and time.  Psychiatric:        Mood and Affect: Mood normal.        Behavior: Behavior normal.        Thought Content: Thought content normal.     Comments: Well groomed, good eye contact, normal speech and thoughts     Results for orders placed or performed in visit on 04/17/24  POCT glycosylated hemoglobin (Hb A1C)   Collection Time: 04/17/24 11:02 AM  Result Value Ref Range   Hemoglobin A1C 9.9 (A) 4.0 - 5.6 %   HbA1c POC (<> result, manual entry)     HbA1c, POC (prediabetic range)     HbA1c, POC (controlled diabetic range)        Assessment & Plan:   Problem List Items Addressed This Visit     DM (diabetes mellitus), type 2 (HCC) - Primary   Relevant Medications   lisinopril  (ZESTRIL ) 5 MG tablet   metFORMIN  (GLUCOPHAGE -XR) 500 MG 24 hr tablet   Other Relevant Orders   POCT glycosylated hemoglobin (Hb A1C) (Completed)   HTN (hypertension)   Relevant Medications   hydrochlorothiazide  (HYDRODIURIL ) 25 MG tablet   lisinopril  (ZESTRIL ) 5 MG tablet   Other Visit Diagnoses       Long term current use of oral hypoglycemic drug             Type 2 Diabetes Mellitus A1c improved to 9.9%. Discussed subdermal glucose monitoring patch as an alternative to finger pricks. Insurance coverage limited; over-the-counter option available. Prefers to avoid insulin, maintain control with metformin  and lifestyle changes. - Order finger prick blood glucose test. - Provide free sample of glucose monitoring patch. - Prescribe metformin  extended-release, 2 tablets twice daily.  Hypertension Blood pressure management with lisinopril  and hydrochlorothiazide . Reports taking lisinopril  at night  due to sleep apnea-related elevation. - Prescribe lisinopril  5 mg twice daily for 90 days with refills. - Prescribe hydrochlorothiazide  25 mg for 90 days with refills.  General Health Maintenance Discussed routine blood work including cholesterol and prostate screening. Financial constraints noted; plans to save for tests and use self-pay discounts. - Schedule routine blood work in six months, including cholesterol and prostate screening.        Orders Placed This Encounter  Procedures   POCT glycosylated  hemoglobin (Hb A1C)    Meds ordered this encounter  Medications   hydrochlorothiazide  (HYDRODIURIL ) 25 MG tablet    Sig: Take 1 tablet (25 mg total) by mouth daily.    Dispense:  90 tablet    Refill:  1   lisinopril  (ZESTRIL ) 5 MG tablet    Sig: Take 1 tablet (5 mg total) by mouth 2 (two) times daily.    Dispense:  180 tablet    Refill:  1   metFORMIN  (GLUCOPHAGE -XR) 500 MG 24 hr tablet    Sig: Take 2 tablets (1,000 mg total) by mouth 2 (two) times daily with a meal.    Dispense:  360 tablet    Refill:  1    Follow up plan: Return in about 6 months (around 10/18/2024) for 6 month Yearly Physical, AM preferred fasting lab after.    Marsa Officer, DO [facility] 04/17/2024, 10:44 AM

## 2024-04-17 NOTE — Patient Instructions (Addendum)
 Thank you for coming to the office today.  Refilled medications  Recent Labs    07/22/23 1131 04/17/24 1102  HGBA1C 10.8* 9.9*   Stelo by dexcom  DUE for FASTING BLOOD WORK (no food or drink after midnight before the lab appointment, only water or coffee without cream/sugar on the morning of)  SCHEDULE Lab Only visit in the morning at the clinic for lab draw in 6 MONTHS   - Make sure Lab Only appointment is at about 1 week before your next appointment, so that results will be available  For Lab Results, once available within 2-3 days of blood draw, you can can log in to MyChart online to view your results and a brief explanation. Also, we can discuss results at next follow-up visit.   Please schedule a Follow-up Appointment to: Return in about 6 months (around 10/18/2024) for 6 month Yearly Physical, AM preferred fasting lab after.  If you have any other questions or concerns, please feel free to call the office or send a message through MyChart. You may also schedule an earlier appointment if necessary.  Additionally, you may be receiving a survey about your experience at our office within a few days to 1 week by e-mail or mail. We value your feedback.  Marsa Officer, DO [facility] [place], [place]

## 2024-10-18 ENCOUNTER — Encounter: Payer: Self-pay | Admitting: Family Medicine

## 2024-10-30 ENCOUNTER — Other Ambulatory Visit: Payer: Self-pay | Admitting: Family Medicine

## 2024-10-30 DIAGNOSIS — I10 Essential (primary) hypertension: Secondary | ICD-10-CM

## 2024-10-30 NOTE — Telephone Encounter (Unsigned)
 Copied from CRM [id number]. Topic: Clinical - Medication Refill >> Oct 30, 2024  9:41 AM Larissa S wrote: Medication: hydrochlorothiazide  (HYDRODIURIL ) 25 MG tablet lisinopril  (ZESTRIL ) 5 MG tablet  Has the patient contacted their pharmacy? Yes (Agent: If no, request that the patient contact the pharmacy for the refill. If patient does not wish to contact the pharmacy document the reason why and proceed with request.) (Agent: If yes, when and what did the pharmacy advise?)  This is the patient's preferred pharmacy:  [facility] [address] [place] - [address] [place] [address] Phone: [phone] Fax: [phone]  Is this the correct pharmacy for this prescription? Yes If no, delete pharmacy and type the correct one.   Has the prescription been filled recently? No  Is the patient out of the medication? Yes  Has the patient been seen for an appointment in the last year OR does the patient have an upcoming appointment? Yes  Can we respond through MyChart? No  Agent: Please be advised that Rx refills may take up to 3 business days. We ask that you follow-up with your pharmacy.

## 2024-10-31 MED ORDER — HYDROCHLOROTHIAZIDE 25 MG PO TABS: 25.0000 mg | ORAL_TABLET | Freq: Every day | ORAL | 1 refills | Status: AC

## 2024-10-31 MED ORDER — LISINOPRIL 5 MG PO TABS: 5.0000 mg | ORAL_TABLET | Freq: Two times a day (BID) | ORAL | 1 refills | Status: AC

## 2024-10-31 NOTE — Telephone Encounter (Signed)
 Requested medication (s) are due for refill today: yes  Requested medication (s) are on the active medication list: yes  Last refill:  hydrochlorothiazide : #90 1 RF          lisinopril : 04/17/24 #180 1 RF  Future visit scheduled: yes  Notes to clinic:  overdue lab work    Requested Prescriptions  Pending Prescriptions Disp Refills   hydrochlorothiazide  (HYDRODIURIL ) 25 MG tablet 90 tablet 1    Sig: Take 1 tablet (25 mg total) by mouth daily.     Cardiovascular: Diuretics - Thiazide Failed - 10/31/2024  2:20 PM      Failed - Cr in normal range and within 180 days    Creat  Date Value Ref Range Status  01/13/2022 0.75 0.70 - 1.30 mg/dL Final   Creatinine, Ser  Date Value Ref Range Status  01/12/2024 0.69 0.61 - 1.24 mg/dL Final         Failed - K in normal range and within 180 days    Potassium  Date Value Ref Range Status  01/12/2024 3.4 (L) 3.5 - 5.1 mmol/L Final         Failed - Na in normal range and within 180 days    Sodium  Date Value Ref Range Status  01/12/2024 133 (L) 135 - 145 mmol/L Final         Failed - Valid encounter within last 6 months    Recent Outpatient Visits           6 months ago Type 2 diabetes mellitus with hyperglycemia, without long-term current use of insulin [facility])   [facility] [place] Marsa PARAS, DO              Passed - Last BP in normal range    BP Readings from Last 1 Encounters:  04/17/24 120/78          lisinopril  (ZESTRIL ) 5 MG tablet 180 tablet 1    Sig: Take 1 tablet (5 mg total) by mouth 2 (two) times daily.     Cardiovascular:  ACE Inhibitors Failed - 10/31/2024  2:20 PM      Failed - Cr in normal range and within 180 days    Creat  Date Value Ref Range Status  01/13/2022 0.75 0.70 - 1.30 mg/dL Final   Creatinine, Ser  Date Value Ref Range Status  01/12/2024 0.69 0.61 - 1.24 mg/dL Final         Failed - K in normal range and within 180 days    Potassium  Date Value Ref Range  Status  01/12/2024 3.4 (L) 3.5 - 5.1 mmol/L Final         Failed - Valid encounter within last 6 months    Recent Outpatient Visits           6 months ago Type 2 diabetes mellitus with hyperglycemia, without long-term current use of insulin [facility])   [facility] [place] Marsa PARAS, [place] - Patient is not pregnant      Passed - Last BP in normal range    BP Readings from Last 1 Encounters:  04/17/24 120/78

## 2024-12-19 ENCOUNTER — Ambulatory Visit: Payer: Self-pay | Admitting: Family Medicine
